# Patient Record
Sex: Female | Born: 1975 | Race: White | Hispanic: No | Smoking: Current every day smoker
Health system: Southern US, Community
[De-identification: ages and names within clinical notes are randomized; demographics above are authoritative.]

## PROBLEM LIST (undated history)

## (undated) DIAGNOSIS — M1990 Unspecified osteoarthritis, unspecified site: Secondary | ICD-10-CM

## (undated) HISTORY — PX: CHOLECYSTECTOMY: SHX55

## (undated) HISTORY — PX: APPENDECTOMY: SHX54

## (undated) HISTORY — PX: TUBAL LIGATION: SHX77

---

## 2003-08-11 ENCOUNTER — Ambulatory Visit (HOSPITAL_COMMUNITY): Admission: RE | Admit: 2003-08-11 | Discharge: 2003-08-11 | Payer: Self-pay | Admitting: Internal Medicine

## 2004-03-20 ENCOUNTER — Other Ambulatory Visit: Admission: RE | Admit: 2004-03-20 | Discharge: 2004-03-20 | Payer: Self-pay | Admitting: Family Medicine

## 2004-03-20 ENCOUNTER — Other Ambulatory Visit: Admission: RE | Admit: 2004-03-20 | Discharge: 2004-03-20 | Payer: Self-pay | Admitting: *Deleted

## 2010-06-20 ENCOUNTER — Emergency Department (HOSPITAL_COMMUNITY): Payer: Medicaid Other

## 2010-06-20 ENCOUNTER — Emergency Department (HOSPITAL_COMMUNITY)
Admission: EM | Admit: 2010-06-20 | Discharge: 2010-06-21 | Disposition: A | Discharge status: Home / Self Care | Payer: Medicaid Other | Attending: Emergency Medicine | Admitting: Emergency Medicine

## 2010-06-20 DIAGNOSIS — S0003XA Contusion of scalp, initial encounter: Secondary | ICD-10-CM | POA: Insufficient documentation

## 2010-06-20 DIAGNOSIS — S1093XA Contusion of unspecified part of neck, initial encounter: Secondary | ICD-10-CM | POA: Insufficient documentation

## 2014-04-04 ENCOUNTER — Encounter: Payer: Self-pay | Admitting: Internal Medicine

## 2021-04-18 ENCOUNTER — Other Ambulatory Visit (HOSPITAL_COMMUNITY): Payer: Self-pay

## 2021-07-01 ENCOUNTER — Emergency Department (HOSPITAL_COMMUNITY)
Admission: EM | Admit: 2021-07-01 | Discharge: 2021-07-02 | Disposition: A | Discharge status: Home / Self Care | Payer: 59 | Attending: Emergency Medicine | Admitting: Emergency Medicine

## 2021-07-01 ENCOUNTER — Encounter (HOSPITAL_COMMUNITY): Payer: Self-pay

## 2021-07-01 DIAGNOSIS — R0602 Shortness of breath: Secondary | ICD-10-CM | POA: Insufficient documentation

## 2021-07-01 DIAGNOSIS — R21 Rash and other nonspecific skin eruption: Secondary | ICD-10-CM | POA: Diagnosis not present

## 2021-07-01 NOTE — ED Triage Notes (Signed)
Pt came in with c/o rash and swelling to RLE. She had previous surgery to that leg. Rash is red, macular and petechial. 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 This has been ongoing since yest. Endorses n/v that started today ?

## 2021-07-02 ENCOUNTER — Emergency Department (HOSPITAL_COMMUNITY): Payer: 59

## 2021-07-02 DIAGNOSIS — R21 Rash and other nonspecific skin eruption: Secondary | ICD-10-CM | POA: Diagnosis present

## 2021-07-02 DIAGNOSIS — R0602 Shortness of breath: Secondary | ICD-10-CM | POA: Diagnosis not present

## 2021-07-02 DIAGNOSIS — M7989 Other specified soft tissue disorders: Secondary | ICD-10-CM | POA: Diagnosis not present

## 2021-07-02 DIAGNOSIS — R7989 Other specified abnormal findings of blood chemistry: Secondary | ICD-10-CM | POA: Diagnosis not present

## 2021-07-02 LAB — COMPREHENSIVE METABOLIC PANEL
ALT: 21 U/L (ref 0–44)
AST: 17 U/L (ref 15–41)
Albumin: 3.3 g/dL — ABNORMAL LOW (ref 3.5–5.0)
Alkaline Phosphatase: 65 U/L (ref 38–126)
Anion gap: 5 (ref 5–15)
BUN: 19 mg/dL (ref 6–20)
CO2: 26 mmol/L (ref 22–32)
Calcium: 8.3 mg/dL — ABNORMAL LOW (ref 8.9–10.3)
Chloride: 106 mmol/L (ref 98–111)
Creatinine, Ser: 0.7 mg/dL (ref 0.44–1.00)
GFR, Estimated: >60 mL/min (ref >60)
Glucose, Bld: 101 mg/dL — ABNORMAL HIGH (ref 70–99)
Potassium: 3.6 mmol/L (ref 3.5–5.1)
Sodium: 137 mmol/L (ref 135–145)
Total Bilirubin: 0.2 mg/dL — ABNORMAL LOW (ref 0.3–1.2)
Total Protein: 6.6 g/dL (ref 6.5–8.1)

## 2021-07-02 LAB — CBC WITH DIFFERENTIAL/PLATELET
Abs Immature Granulocytes: 0.06 10*3/uL (ref 0.00–0.07)
Basophils Absolute: 0.1 10*3/uL (ref 0.0–0.1)
Basophils Relative: 1 %
Eosinophils Absolute: 0.2 10*3/uL (ref 0.0–0.5)
Eosinophils Relative: 2 %
HCT: 40.5 % (ref 36.0–46.0)
Hemoglobin: 13 g/dL (ref 12.0–15.0)
Immature Granulocytes: 1 %
Lymphocytes Relative: 22 %
Lymphs Abs: 1.8 10*3/uL (ref 0.7–4.0)
MCH: 30.4 pg (ref 26.0–34.0)
MCHC: 32.1 g/dL (ref 30.0–36.0)
MCV: 94.8 fL (ref 80.0–100.0)
Monocytes Absolute: 0.7 10*3/uL (ref 0.1–1.0)
Monocytes Relative: 9 %
Neutro Abs: 5.4 10*3/uL (ref 1.7–7.7)
Neutrophils Relative %: 65 %
Platelets: 263 10*3/uL (ref 150–400)
RBC: 4.27 MIL/uL (ref 3.87–5.11)
RDW: 12.1 % (ref 11.5–15.5)
WBC: 8.2 10*3/uL (ref 4.0–10.5)
nRBC: 0 % (ref 0.0–0.2)

## 2021-07-02 LAB — TROPONIN I (HIGH SENSITIVITY): Troponin I (High Sensitivity): <2 ng/L (ref <18)

## 2021-07-02 LAB — POC URINE PREG, ED: Preg Test, Ur: NEGATIVE

## 2021-07-02 LAB — D-DIMER, QUANTITATIVE: D-Dimer, Quant: 0.78 ug/mL-FEU — ABNORMAL HIGH (ref 0.00–0.50)

## 2021-07-02 LAB — HCG, QUANTITATIVE, PREGNANCY: hCG, Beta Chain, Quant, S: <1 m[IU]/mL (ref <5)

## 2021-07-02 LAB — BRAIN NATRIURETIC PEPTIDE: B Natriuretic Peptide: 18 pg/mL (ref 0.0–100.0)

## 2021-07-02 MED ORDER — IOHEXOL 350 MG/ML SOLN
75.0000 mL | Freq: Once | INTRAVENOUS | Status: AC | PRN
  Administered 2021-07-02: 75 mL via INTRAVENOUS

## 2021-07-02 MED ORDER — RIVAROXABAN (XARELTO) VTE STARTER PACK (15 & 20 MG): ORAL_TABLET | ORAL | 0 refills | Status: DC

## 2021-07-02 MED ORDER — CEPHALEXIN 500 MG PO CAPS: 500.0000 mg | ORAL_CAPSULE | Freq: Four times a day (QID) | ORAL | 0 refills | Status: DC

## 2021-07-02 MED ORDER — CEPHALEXIN 500 MG PO CAPS
1000.0000 mg | ORAL_CAPSULE | Freq: Once | ORAL | Status: AC
  Administered 2021-07-02: 1000 mg via ORAL
  Filled 2021-07-02: qty 2

## 2021-07-02 NOTE — ED Provider Notes (Signed)
?[facility] ?Provider Note ? ? ?CSN: [id number] ?Arrival date & time: 07/01/21  2306 ? ?  ? ?History ? ?Chief Complaint  ?Patient presents with  ? Rash  ? Shortness of Breath  ? ? ?Christie Nicholson is a 46 y.o. female. ? ?46 year old female presents the ER today secondary to multiple symptoms.  She is dates that over the last couple days she has had a rash to her right leg with some swelling.  She also has noticed some shortness of breath during that time as well.  No fevers.  States it is tender to touch. ? ? ?Rash ?Associated symptoms: shortness of breath   ?Shortness of Breath ?Associated symptoms: rash   ? ?  ? ?Home Medications ?Prior to Admission medications   ?Medication Sig Start Date End Date Taking? Authorizing Provider  ?cephALEXin (KEFLEX) 500 MG capsule Take 1 capsule (500 mg total) by mouth 4 (four) times daily. 07/02/21  Yes Hason Ofarrell, [name], MD  ?RIVAROXABAN [name] Reasons) VTE STARTER PACK (15 & 20 MG) Follow package directions: Take one 15mg  tablet by mouth twice a day. On day 22, switch to one 20mg  tablet once a day. Take with food. 07/02/21  Yes Raevin Wierenga, [name], MD  ?   ? ?Allergies    ?Ciprofloxacin and Phenergan [promethazine hcl]   ? ?Review of Systems   ?Review of Systems  ?Respiratory:  Positive for shortness of breath.   ?Skin:  Positive for rash.  ? ?Physical Exam ?Updated Vital Signs ?BP 117/77   Pulse 96   Temp 97.8 ?F (36.6 ?C) (Oral)   Resp 20   Ht 5\' 4"  (1.626 m)   Wt 61.2 kg   SpO2 100%   BMI 23.17 kg/m?  ?Physical Exam ?Vitals and nursing note reviewed.  ?Constitutional:   ?   Appearance: She is well-developed.  ?HENT:  ?   Head: Normocephalic and atraumatic.  ?Cardiovascular:  ?   Rate and Rhythm: Normal rate and regular rhythm.  ?Pulmonary:  ?   Effort: No respiratory distress.  ?   Breath sounds: No stridor. No decreased breath sounds, wheezing or rhonchi.  ?Abdominal:  ?   General: There is no distension.  ?Musculoskeletal:     ?   General: Normal range of motion.  ?    Cervical back: Normal range of motion.  ?   Right lower leg: Tenderness present. Edema (T. Keele rash to her right leg, warm and tender to touch, mildly indurated) present.  ?   Left lower leg: No tenderness. No edema.  ?Neurological:  ?   General: No focal deficit present.  ?   Mental Status: She is alert.  ? ? ?ED Results / Procedures / Treatments   ?Labs ?(all labs ordered are listed, but only abnormal results are displayed) ?Labs Reviewed  ?COMPREHENSIVE METABOLIC PANEL - Abnormal; Notable for the following components:  ?    Result Value  ? Glucose, Bld 101 (*)   ? Calcium 8.3 (*)   ? Albumin 3.3 (*)   ? Total Bilirubin 0.2 (*)   ? All other components within normal limits  ?D-DIMER, QUANTITATIVE - Abnormal; Notable for the following components:  ? D-Dimer, Quant 0.78 (*)   ? All other components within normal limits  ?CBC WITH DIFFERENTIAL/PLATELET  ?BRAIN NATRIURETIC PEPTIDE  ?HCG, QUANTITATIVE, PREGNANCY  ?POC URINE PREG, ED  ?TROPONIN I (HIGH SENSITIVITY)  ? ? ?EKG ?None ? ?Radiology ?DG Chest 2 View ? ?Result Date: 07/02/2021 ?CLINICAL DATA:  Shortness of  breath and right lower extremity swelling. EXAM: CHEST - 2 VIEW COMPARISON:  None. FINDINGS: The heart size and mediastinal contours are within normal limits. The lungs are hyperinflated. Both lungs are clear. Radiopaque surgical clips are seen within the right upper quadrant. Bilateral chronic rib fractures are noted. IMPRESSION: No active cardiopulmonary disease. Electronically Signed   By: Virgina Norfolk M.D.   On: 07/02/2021 00:49  ? ?CT Angio Chest PE W and/or Wo Contrast ? ?Result Date: 07/02/2021 ?CLINICAL DATA:  Shortness of breath with positive D-dimer. EXAM: CT ANGIOGRAPHY CHEST WITH CONTRAST TECHNIQUE: Multidetector CT imaging of the chest was performed using the standard protocol during bolus administration of intravenous contrast. Multiplanar CT image reconstructions and MIPs were obtained to evaluate the vascular anatomy. RADIATION DOSE  REDUCTION: This exam was performed according to the departmental dose-optimization program which includes automated exposure control, adjustment of the mA and/or kV according to patient size and/or use of iterative reconstruction technique. CONTRAST:  36mL OMNIPAQUE IOHEXOL 350 MG/ML SOLN COMPARISON:  None. FINDINGS: Cardiovascular: Satisfactory opacification of the pulmonary arteries to the segmental level. No evidence of pulmonary embolism. Normal heart size. No pericardial effusion. Mediastinum/Nodes: No enlarged mediastinal, hilar, or axillary lymph nodes. Thyroid gland, trachea, and esophagus demonstrate no significant findings. Lungs/Pleura: Lungs are clear. No pleural effusion or pneumothorax. Upper Abdomen: Cholecystectomy clips are present. Musculoskeletal: No chest wall abnormality. No acute or significant osseous findings. Review of the MIP images confirms the above findings. IMPRESSION: 1. No evidence for pulmonary embolism. 2. No acute cardiopulmonary process. Electronically Signed   By: Ronney Asters M.D.   On: 07/02/2021 02:26   ? ?Procedures ?Procedures  ? ? ?Medications Ordered in ED ?Medications  ?iohexol (OMNIPAQUE) 350 MG/ML injection 75 mL (75 mLs Intravenous Contrast Given 07/02/21 0202)  ?cephALEXin (KEFLEX) capsule 1,000 mg (1,000 mg Oral Given 07/02/21 0257)  ? ? ?ED Course/ Medical Decision Making/ A&P ?  ?                        ?Medical Decision Making ?Amount and/or Complexity of Data Reviewed ?Labs: ordered. ?Radiology: ordered. ?ECG/medicine tests: ordered. ? ?Risk ?Prescription drug management. ? ?Here with rash/edema to RLE with associated dyspnea. Will evaluate for DVT/PE vs cellulitis.  ?D dimer elevated. Will ct scan to ensure no PE. If negative, will need to return tomorrow for DVT study. If positive, will start anticoagulation and dispo appropriately.  ?Ct negative. Rx for xarelto and keflex given, will start one or other depending on [place] results, verbally explained and wrote on  prescriptions.  ? ? ?Final Clinical Impression(s) / ED Diagnoses ?Final diagnoses:  ?Rash  ? ? ?Rx / DC Orders ?ED Discharge Orders   ? ?      Ordered  ?  US Venous Img Lower Unilateral Right       ? 07/02/21 0246  ?  cephALEXin (KEFLEX) 500 MG capsule  4 times daily       ? 07/02/21 0246  ?  RIVAROXABAN (XARELTO) VTE STARTER PACK (15 & 20 MG)       ? 07/02/21 0246  ? ?  ?  ? ?  ? ? ?  ?Merrily Pew, MD ?07/02/21 [phone] ? ?

## 2021-07-03 ENCOUNTER — Telehealth: Payer: Self-pay

## 2021-07-03 NOTE — Telephone Encounter (Signed)
Called pt and made appt

## 2021-07-11 ENCOUNTER — Encounter: Payer: Self-pay | Admitting: Physician Assistant

## 2021-07-11 DIAGNOSIS — I878 Other specified disorders of veins: Secondary | ICD-10-CM | POA: Diagnosis not present

## 2021-07-11 DIAGNOSIS — M79604 Pain in right leg: Secondary | ICD-10-CM | POA: Diagnosis not present

## 2021-07-11 DIAGNOSIS — M7989 Other specified soft tissue disorders: Secondary | ICD-10-CM | POA: Diagnosis not present

## 2021-07-11 DIAGNOSIS — I83018 Varicose veins of right lower extremity with ulcer other part of lower leg: Secondary | ICD-10-CM | POA: Diagnosis not present

## 2021-07-17 DIAGNOSIS — H16142 Punctate keratitis, left eye: Secondary | ICD-10-CM | POA: Diagnosis not present

## 2021-07-17 DIAGNOSIS — T1512XA Foreign body in conjunctival sac, left eye, initial encounter: Secondary | ICD-10-CM | POA: Diagnosis not present

## 2021-07-25 ENCOUNTER — Ambulatory Visit: Payer: 59 | Admitting: Physician Assistant

## 2021-07-25 ENCOUNTER — Encounter: Payer: Self-pay | Admitting: Physician Assistant

## 2021-07-25 NOTE — Progress Notes (Unsigned)
Patient ID: Christie Nicholson, female   DOB: 01-Oct-1975, 46 y.o.   MRN: [id number]  After ED visit 07/01/2021 Here with rash/edema to RLE with associated dyspnea. Will evaluate for DVT/PE vs cellulitis.  D dimer elevated. Will ct scan to ensure no PE. If negative, will need to return tomorrow for DVT study. If positive, will start anticoagulation and dispo appropriately.  Ct negative. Rx for xarelto and keflex given, will start one or other depending on [place] results, verbally explained and wrote on prescriptions.

## 2021-10-19 DIAGNOSIS — Z791 Long term (current) use of non-steroidal anti-inflammatories (NSAID): Secondary | ICD-10-CM | POA: Diagnosis not present

## 2021-10-19 DIAGNOSIS — G8929 Other chronic pain: Secondary | ICD-10-CM | POA: Diagnosis not present

## 2021-10-19 DIAGNOSIS — R69 Illness, unspecified: Secondary | ICD-10-CM | POA: Diagnosis not present

## 2021-10-19 DIAGNOSIS — Z881 Allergy status to other antibiotic agents status: Secondary | ICD-10-CM | POA: Diagnosis not present

## 2021-10-19 DIAGNOSIS — Z8249 Family history of ischemic heart disease and other diseases of the circulatory system: Secondary | ICD-10-CM | POA: Diagnosis not present

## 2021-10-19 DIAGNOSIS — R32 Unspecified urinary incontinence: Secondary | ICD-10-CM | POA: Diagnosis not present

## 2021-10-19 DIAGNOSIS — Z9181 History of falling: Secondary | ICD-10-CM | POA: Diagnosis not present

## 2022-03-31 ENCOUNTER — Encounter (HOSPITAL_COMMUNITY): Payer: Self-pay | Admitting: Emergency Medicine

## 2022-03-31 ENCOUNTER — Emergency Department (HOSPITAL_COMMUNITY)
Admission: EM | Admit: 2022-03-31 | Discharge: 2022-03-31 | Disposition: A | Discharge status: Home / Self Care | Payer: Medicaid Other | Attending: Emergency Medicine | Admitting: Emergency Medicine

## 2022-03-31 ENCOUNTER — Other Ambulatory Visit: Payer: Self-pay

## 2022-03-31 DIAGNOSIS — X58XXXA Exposure to other specified factors, initial encounter: Secondary | ICD-10-CM | POA: Insufficient documentation

## 2022-03-31 DIAGNOSIS — S0501XA Injury of conjunctiva and corneal abrasion without foreign body, right eye, initial encounter: Secondary | ICD-10-CM | POA: Diagnosis not present

## 2022-03-31 DIAGNOSIS — H5789 Other specified disorders of eye and adnexa: Secondary | ICD-10-CM | POA: Diagnosis present

## 2022-03-31 MED ORDER — TOBRAMYCIN 0.3 % OP SOLN: 1.0000 [drp] | OPHTHALMIC | 0 refills | Status: DC

## 2022-03-31 MED ORDER — FLUORESCEIN SODIUM 1 MG OP STRP
1.0000 | ORAL_STRIP | Freq: Once | OPHTHALMIC | Status: DC
  Filled 2022-03-31: qty 1

## 2022-03-31 MED ORDER — FLUORESCEIN SODIUM 1 MG OP STRP
1.0000 | ORAL_STRIP | Freq: Once | OPHTHALMIC | Status: AC
  Administered 2022-03-31: 1 via OPHTHALMIC

## 2022-03-31 MED ORDER — TETRACAINE HCL 0.5 % OP SOLN
1.0000 [drp] | Freq: Once | OPHTHALMIC | Status: AC
  Administered 2022-03-31: 1 [drp] via OPHTHALMIC

## 2022-03-31 MED ORDER — TETRACAINE HCL 0.5 % OP SOLN
2.0000 [drp] | Freq: Once | OPHTHALMIC | Status: DC
  Filled 2022-03-31: qty 4

## 2022-03-31 NOTE — ED Triage Notes (Signed)
Scratched right eye yesterday and seen at [facility] and was told to come to ED. States put the contact back in bc it made the pain better. Woke this am with swelling and severe pain. Mild redness/swelling noted to right lids and redness noted to sclera. Eyes watering. Nad.

## 2022-03-31 NOTE — Discharge Instructions (Signed)
Please follow-up with your eye doctor within the next 48 hours for a formal eye exam Absolutely do not wear any contact lenses or rub your eyes until you see the eye doctor Tobramycin is the eyedrop which I have prescribed, make sure you are using 1 or 2 drops in the right eye every 4 hours Tylenol or ibuprofen for pain

## 2022-03-31 NOTE — ED Provider Notes (Signed)
[name] Provider Note   CSN: [id number] Arrival date & time: 03/31/22  1020     History  Chief Complaint  Patient presents with   Eye Pain    Christie Nicholson is a 47 y.o. female.   Eye Pain   Scratched eye yesterday Used contact last night b/c states it made it felt better Saw [facility] yesterday and told to come here b/c they couldn't take care of her and to come to ED - she didn't until today She put a contact lens back in last night because it made it feel little bit better but this morning she had increasing redness pain and swelling of the eyelid.    Home Medications Prior to Admission medications   Medication Sig Start Date End Date Taking? Authorizing Provider  tobramycin (TOBREX) 0.3 % ophthalmic solution Place 1 drop into the right eye every 4 (four) hours. Place one drop in the affected eye every 4 hours for 7 days 03/31/22  Yes [name], MD  cephALEXin (KEFLEX) 500 MG capsule Take 1 capsule (500 mg total) by mouth 4 (four) times daily. 07/02/21   Mesner, [name], MD  RIVAROXABAN [name] Reasons) VTE STARTER PACK (15 & 20 MG) Follow package directions: Take one 15mg  tablet by mouth twice a day. On day 22, switch to one 20mg  tablet once a day. Take with food. 07/02/21   Mesner, [name], MD      Allergies    Ciprofloxacin and Phenergan [promethazine hcl]    Review of Systems   Review of Systems  Eyes:  Positive for pain.    Physical Exam Updated Vital Signs BP (!) 145/100 (BP Location: Right Arm)   Pulse 100   Temp 98.2 F (36.8 C) (Oral)   Resp 18   LMP 08/01/2021 (Approximate) Comment: "Im going through early menopause"  SpO2 100%  Physical Exam Vitals and nursing note reviewed.  Constitutional:      Appearance: She is well-developed. She is not diaphoretic.  HENT:     Head: Normocephalic and atraumatic.  Eyes:     Comments: Watering of the right eye, minimal swelling of the right eyelid, no foreign body seen, left  eye is totally normal.  Under fluorescein and tetracaine with a Woods lamp the right eye is seen to have a large central corneal abrasion overlying the middle of the cornea.  There is no leakage of intraocular fluid  Pulmonary:     Effort: Pulmonary effort is normal. No respiratory distress.  Skin:    General: Skin is warm and dry.     Findings: No erythema or rash.  Neurological:     Mental Status: She is alert.     Coordination: Coordination normal.     ED Results / Procedures / Treatments   Labs (all labs ordered are listed, but only abnormal results are displayed) Labs Reviewed - No data to display  EKG None  Radiology No results found.  Procedures Procedures    Medications Ordered in ED Medications  fluorescein ophthalmic strip 1 strip (has no administration in time range)  tetracaine (PONTOCAINE) 0.5 % ophthalmic solution 1 drop (has no administration in time range)    ED Course/ Medical Decision Making/ A&P                             Medical Decision Making Risk Prescription drug management.   The patient was counseled at length regarding  her corneal injury and the treatment including antibiotics, avoiding contact lenses or rubbing and following up with a local ophthalmologist, she has agreed to do all of these things.  She appears stable for discharge, she will be given a prescription for tobramycin.  She wanted to take the tetracaine home but I told her this was not advised given the potential softening of the cornea that it would cause.  She is agreeable        Final Clinical Impression(s) / ED Diagnoses Final diagnoses:  Abrasion of right cornea, initial encounter    Rx / DC Orders ED Discharge Orders          Ordered    tobramycin (TOBREX) 0.3 % ophthalmic solution  Every 4 hours        03/31/22 1046              [name], MD 03/31/22 1048

## 2022-08-20 DIAGNOSIS — R1010 Upper abdominal pain, unspecified: Secondary | ICD-10-CM | POA: Diagnosis not present

## 2022-08-20 DIAGNOSIS — N924 Excessive bleeding in the premenopausal period: Secondary | ICD-10-CM | POA: Diagnosis not present

## 2022-08-20 DIAGNOSIS — Z6824 Body mass index (BMI) 24.0-24.9, adult: Secondary | ICD-10-CM | POA: Diagnosis not present

## 2022-08-20 DIAGNOSIS — I10 Essential (primary) hypertension: Secondary | ICD-10-CM | POA: Diagnosis not present

## 2022-08-20 DIAGNOSIS — M24671 Ankylosis, right ankle: Secondary | ICD-10-CM | POA: Diagnosis not present

## 2022-08-24 IMAGING — DX DG CHEST 2V
2 series · 2 of 2 positions shown · non-contrast
Comparison: None.

CLINICAL DATA: Shortness of breath and right lower extremity
swelling.

EXAM:
CHEST - 2 VIEW

[chest pa]
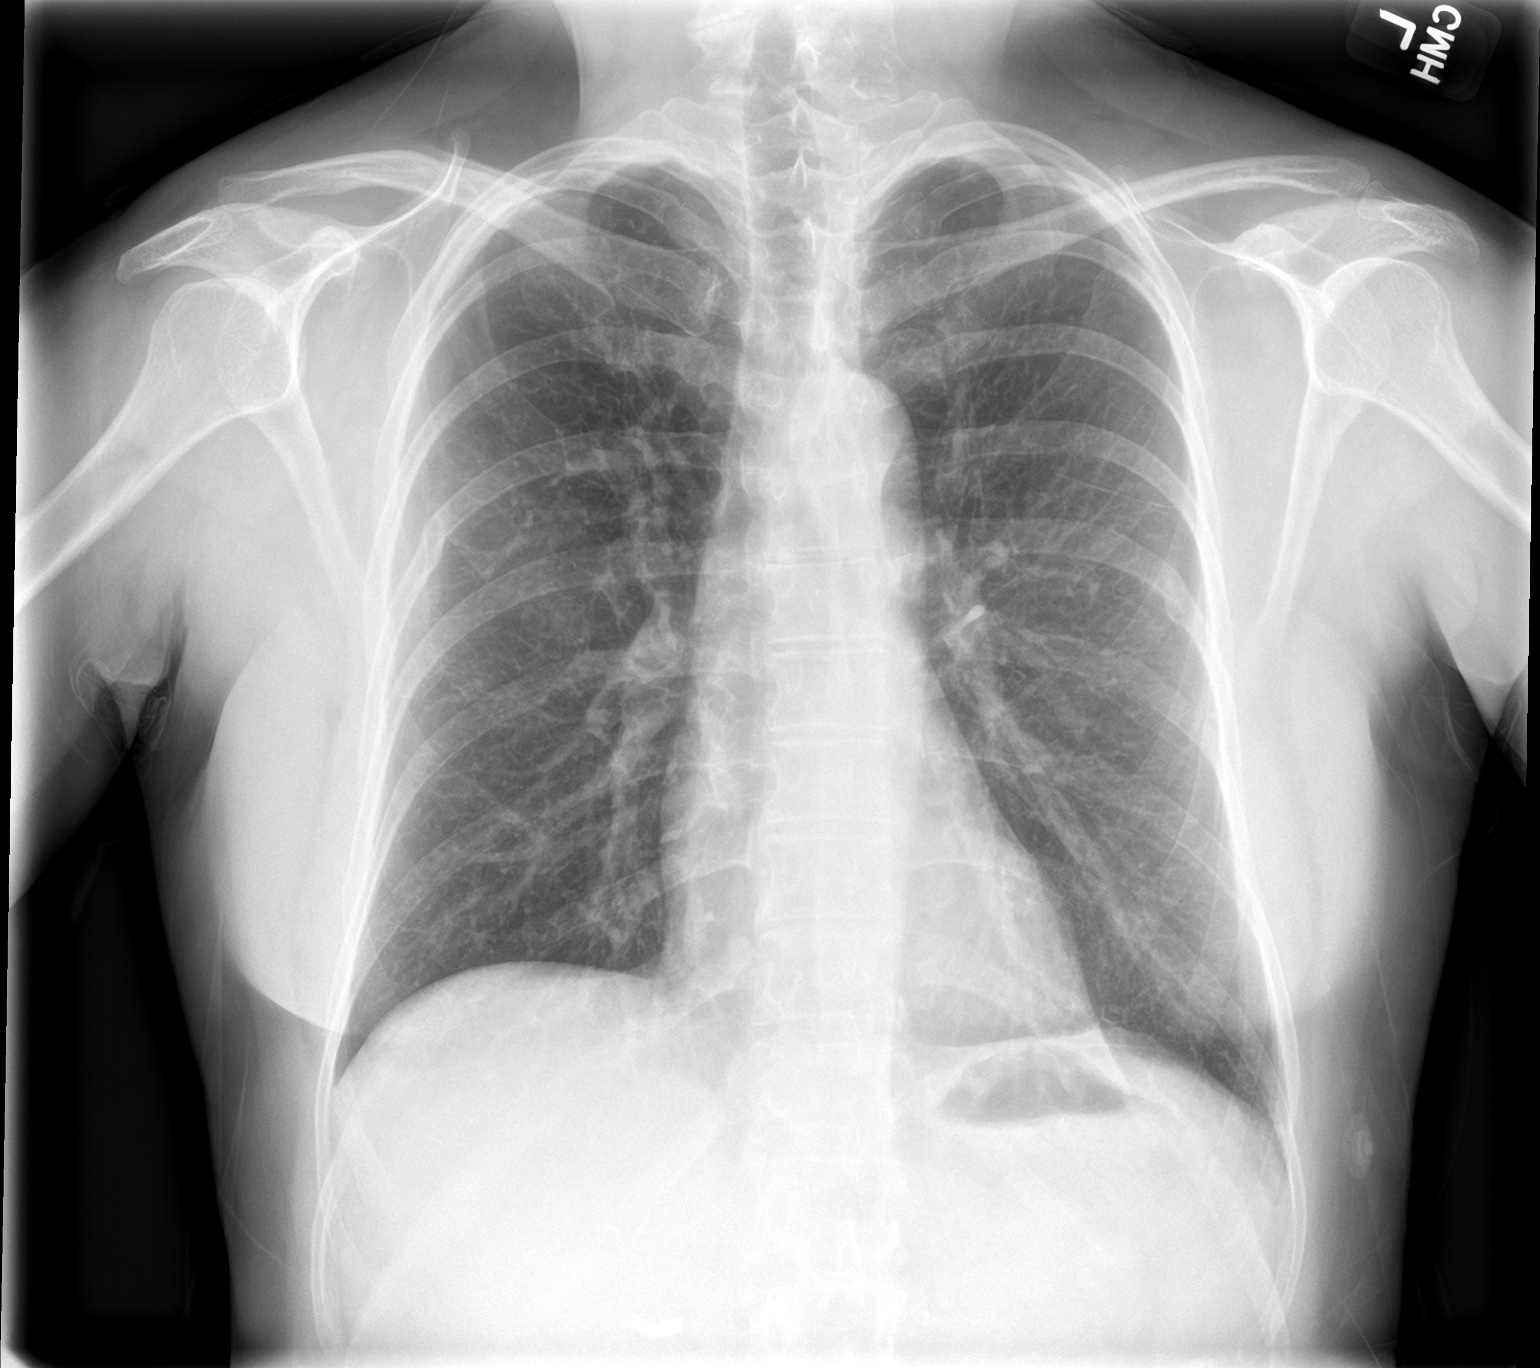

[chest lat]
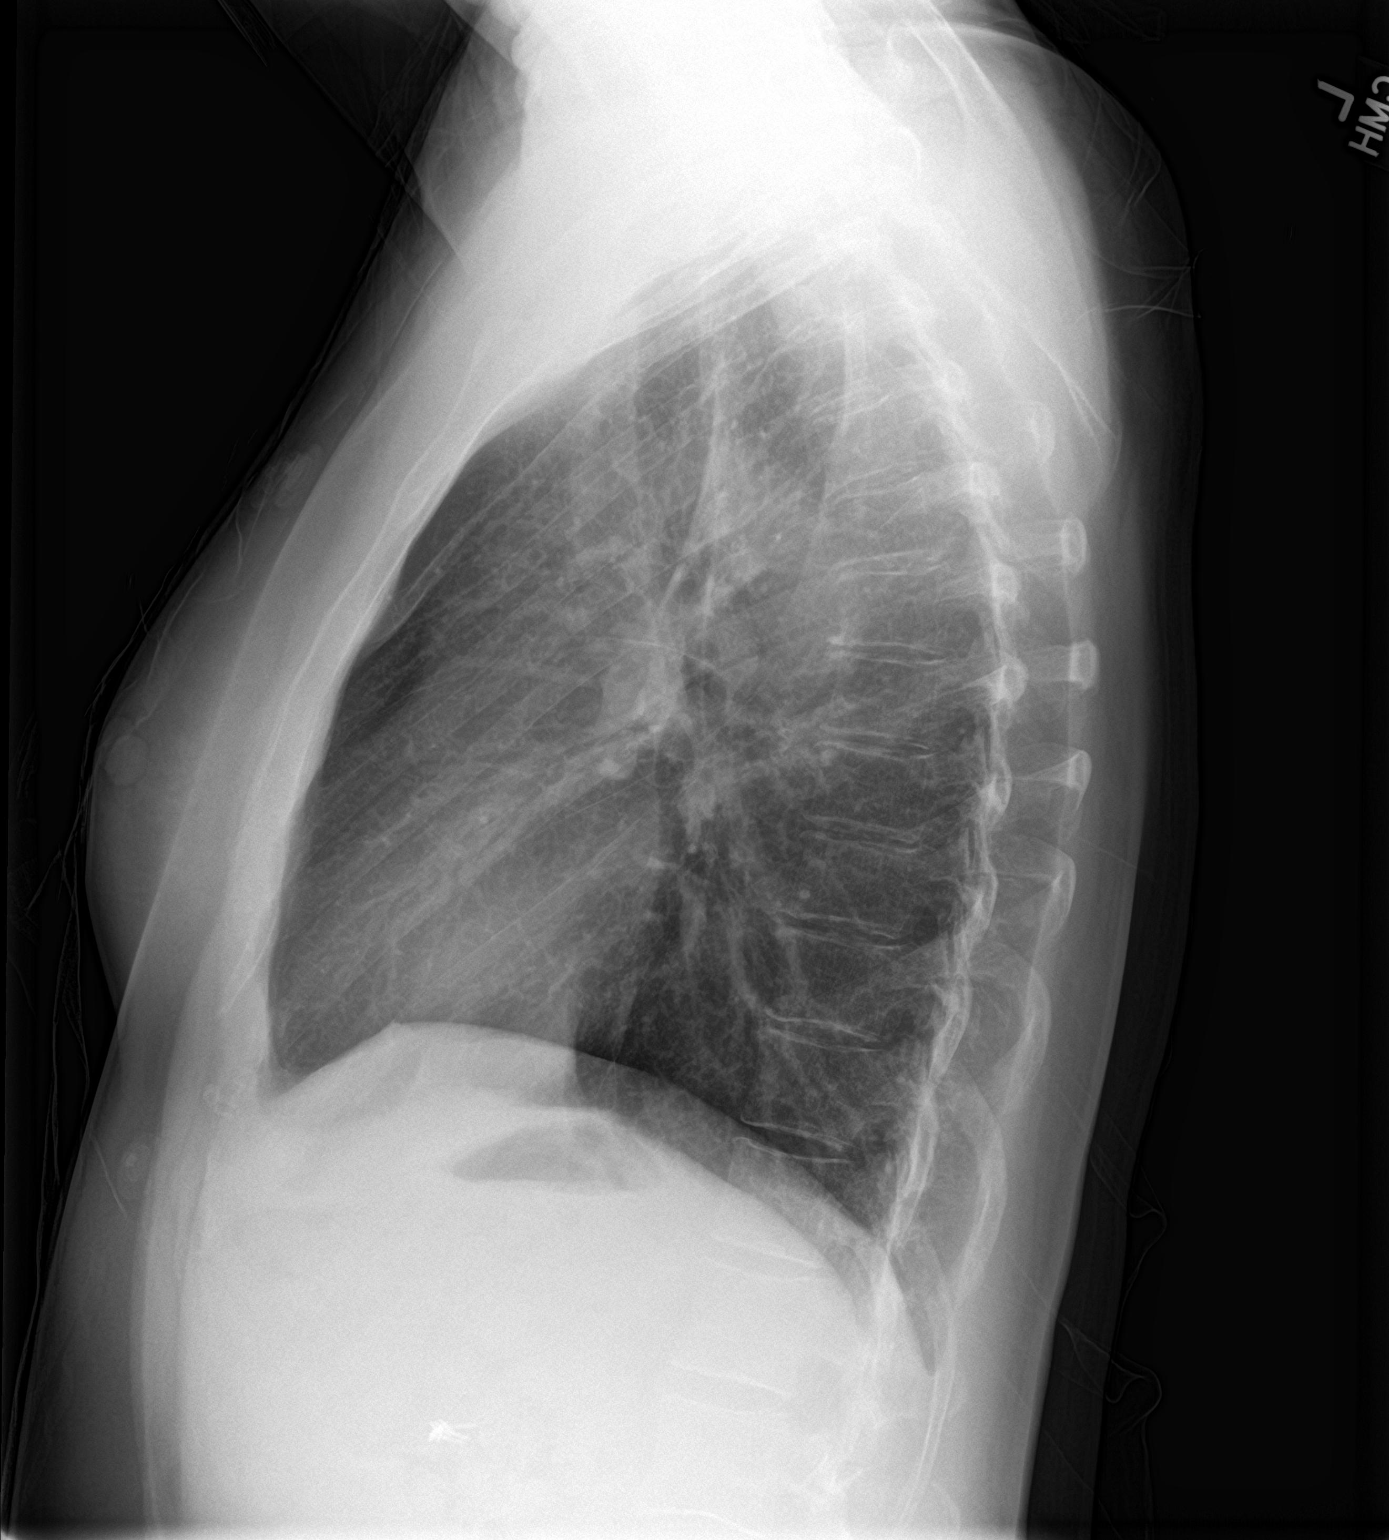

[2 of 2 positions shown; findings below may reference images not displayed]

FINDINGS: The heart size and mediastinal contours are within normal limits.
The lungs are hyperinflated. Both lungs are clear. Radiopaque
surgical clips are seen within the right upper quadrant. Bilateral
chronic rib fractures are noted.
IMPRESSION: No active cardiopulmonary disease.

## 2022-08-29 ENCOUNTER — Encounter: Payer: Self-pay | Admitting: Orthopaedic Surgery

## 2022-08-29 ENCOUNTER — Ambulatory Visit (INDEPENDENT_AMBULATORY_CARE_PROVIDER_SITE_OTHER): Payer: 59 | Admitting: Orthopaedic Surgery

## 2022-08-29 VITALS — Ht 64.0 in | Wt 143.0 lb

## 2022-08-29 DIAGNOSIS — S92102K Unspecified fracture of left talus, subsequent encounter for fracture with nonunion: Secondary | ICD-10-CM | POA: Diagnosis not present

## 2022-08-29 DIAGNOSIS — M25571 Pain in right ankle and joints of right foot: Secondary | ICD-10-CM

## 2022-08-29 DIAGNOSIS — R109 Unspecified abdominal pain: Secondary | ICD-10-CM | POA: Diagnosis not present

## 2022-08-29 DIAGNOSIS — R102 Pelvic and perineal pain: Secondary | ICD-10-CM | POA: Diagnosis not present

## 2022-09-03 DIAGNOSIS — S92111K Displaced fracture of neck of right talus, subsequent encounter for fracture with nonunion: Secondary | ICD-10-CM | POA: Insufficient documentation

## 2022-09-03 DIAGNOSIS — S92102K Unspecified fracture of left talus, subsequent encounter for fracture with nonunion: Secondary | ICD-10-CM | POA: Insufficient documentation
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
## 2022-11-07 ENCOUNTER — Ambulatory Visit (INDEPENDENT_AMBULATORY_CARE_PROVIDER_SITE_OTHER): Payer: Medicaid Other | Admitting: Orthopaedic Surgery

## 2022-11-07 DIAGNOSIS — S92111K Displaced fracture of neck of right talus, subsequent encounter for fracture with nonunion: Secondary | ICD-10-CM
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
## 2022-12-05 ENCOUNTER — Ambulatory Visit: Payer: Medicaid Other | Admitting: Orthopaedic Surgery

## 2022-12-05 DIAGNOSIS — S92111K Displaced fracture of neck of right talus, subsequent encounter for fracture with nonunion: Secondary | ICD-10-CM

## 2022-12-05 NOTE — Progress Notes (Signed)
Office Visit Note   Patient: Christie Nicholson           Date of Birth: November 25, 1975           MRN: [id number] Visit Date: 12/05/2022              Requested by: Medicine, [facility] Internal [address] [place],  [place] [address] PCP: Medicine, [facility] Internal   Assessment & Plan: Visit Diagnoses:  1. 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  She will need additional surgery she will call to see Dr. Lajoyce Corners.  Follow-Up Instructions: No follow-ups on file.   Orders:  No orders of the defined types were placed in this encounter.  No orders of the defined types were placed in this encounter.     Procedures: No procedures performed   Clinical Data: No additional findings.   Subjective: Chief Complaint  Patient presents with   Right Ankle - Pain, Follow-up    Here for paperwork.     HPI 47 year old female returns I discussed with her getting into see Dr. Lajoyce Corners she states she does not recall anything about that discussion.  Patient CT scan we reviewed again which shows talar neck nonunion.  She has hardware present which is loose had subtalar arthritis and will need surgery to stabilize her ankle.  Is not able to do her job standing up until this is taken care of.  I discussed with her she likely going to be out for 3 to 4 months after the surgery as it heals and then should be able to resume work activity.  Her attorney is sent a form or paralegal sent a form that looks like an Administrator, arts.  If they like an FCE they can prepay for the FCE which is usually about $500.  Patient likely will need fusion surgery.  I gave her the phone number to schedule appointment with Dr. Lajoyce Corners.  Review of Systems unchanged   Objective: Vital Signs: There were no vitals taken for this visit.  Physical Exam Constitutional:       Appearance: She is well-developed.  HENT:     Head: Normocephalic.     Right Ear: External ear normal.     Left Ear: External ear normal. There is no impacted cerumen.  Eyes:     Pupils: Pupils are equal, round, and reactive to light.  Neck:     Thyroid: No thyromegaly.     Trachea: No tracheal deviation.  Cardiovascular:     Rate and Rhythm: Normal rate.  Pulmonary:     Effort: Pulmonary effort is normal.  Abdominal:     Palpations: Abdomen is soft.  Musculoskeletal:     Cervical back: No rigidity.  Skin:    General: Skin is warm and dry.  Neurological:     Mental Status: She is alert and oriented to person, place, and time.  Psychiatric:        Behavior: Behavior normal.     Ortho Exam patient has pain with subtalar motion ankle range of motion she is ambulating with a cane.  Healed scars on her ankle from previous surgery.  Specialty Comments:  No specialty comments available.  Imaging:  [facility] Outside Information   CT Lower Extremity Right Wo Contrast  Anatomical Region Laterality Modality  Hip right Computed Tomography  Thigh -- --  Knee -- --  Leg -- --  Ankle -- --  Foot -- --   Impression  1. Healed talar neck fracture status post ORIF. No hardware complication. 2. Severe post-traumatic posterior subtalar osteoarthritis. 3. Mildly increased sclerosis along the anterior aspect of the talar dome, which could reflect chronic avascular necrosis. No subchondral collapse.   Electronically Signed   By: Obie Dredge M.D.   On: 10/31/2022 08:40 Narrative  CLINICAL DATA:  Chronic right foot pain. History of prior fracture and ORIF in March 2022.  EXAM: CT OF THE LOWER RIGHT EXTREMITY WITHOUT CONTRAST  TECHNIQUE: Multidetector CT imaging of the right lower extremity was performed according to the standard protocol.  RADIATION DOSE REDUCTION: This exam was performed according to the departmental dose-optimization program which includes  automated exposure control, adjustment of the mA and/or kV according to patient size and/or use of iterative reconstruction technique.  COMPARISON:  Right ankle x-rays dated June 19, 2022.  FINDINGS: Bones/Joint/Cartilage  Healed talar neck fracture status post ORIF. No evidence of hardware failure or loosening. No acute fracture or dislocation. There are three well corticated fracture fragments along the medial, posterior, and lateral aspects of the subtalar joint. The largest fragment laterally likely represents the displaced lateral talar process. There is some mildly increased sclerosis along the anterior aspect of the talar dome.  There is severe posterior subtalar joint space narrowing with subchondral sclerosis, cystic change, and marginal osteophytosis. Remaining joint spaces are relatively preserved. The ankle mortise is symmetric. The talar dome is intact. Osteopenia. Small tibiotalar joint effusion.  Ligaments  Ligaments are suboptimally evaluated by CT.  Muscles and Tendons Grossly intact.  Soft tissue No fluid collection or hematoma.  No soft tissue mass. Procedure Note  Rica Records, MD - 10/31/2022 Formatting of this note might be different from the original. CLINICAL DATA:  Chronic right foot pain. History of prior fracture and ORIF in March 2022.  EXAM: CT OF THE LOWER RIGHT EXTREMITY WITHOUT CONTRAST  TECHNIQUE: Multidetector CT imaging of the right lower extremity was performed according to the standard protocol.  RADIATION DOSE REDUCTION: This exam was performed according to the departmental dose-optimization program which includes automated exposure control, adjustment of the mA and/or kV according to patient size and/or use of iterative reconstruction technique.  COMPARISON:  Right ankle x-rays dated June 19, 2022.  FINDINGS: Bones/Joint/Cartilage  Healed talar neck fracture status post ORIF. No evidence of hardware failure or  loosening. No acute fracture or dislocation. There are three well corticated fracture fragments along the medial, posterior, and lateral aspects of the subtalar joint. The largest fragment laterally likely represents the displaced lateral talar process. There is some mildly increased sclerosis along the anterior aspect of the talar dome.  There is severe posterior subtalar joint space narrowing with subchondral sclerosis, cystic change, and marginal osteophytosis. Remaining joint spaces are relatively preserved. The ankle mortise is symmetric. The talar dome is intact. Osteopenia. Small tibiotalar joint effusion.  Ligaments  Ligaments are suboptimally evaluated by CT.  Muscles and Tendons Grossly intact.  Soft tissue No fluid collection or hematoma.  No soft tissue mass.  IMPRESSION: 1. Healed talar neck fracture status post ORIF. No hardware complication. 2. Severe post-traumatic posterior subtalar osteoarthritis. 3. Mildly increased sclerosis along the anterior aspect of the talar dome, which could reflect chronic avascular necrosis. No subchondral collapse.   Electronically Signed   By: Obie Dredge M.D.   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  No family history on file.  Past Surgical History:  Procedure Laterality Date   APPENDECTOMY     CHOLECYSTECTOMY     TUBAL LIGATION     Social History   Occupational History   Not on file  Tobacco Use   Smoking status: Every Day    Current packs/day: 1.00    Types: Cigarettes   Smokeless tobacco: Never  Substance and Sexual Activity   Alcohol use: Not Currently   Drug use: Yes    Types:  Methamphetamines    Comment: last used nov 2021   Sexual activity: Not on file

## 2022-12-16 ENCOUNTER — Ambulatory Visit (INDEPENDENT_AMBULATORY_CARE_PROVIDER_SITE_OTHER): Payer: Medicaid Other | Admitting: Orthopedic Surgery

## 2022-12-16 DIAGNOSIS — S92102K Unspecified fracture of left talus, subsequent encounter for fracture with nonunion: Secondary | ICD-10-CM

## 2022-12-16 DIAGNOSIS — M25571 Pain in right ankle and joints of right foot: Secondary | ICD-10-CM | POA: Diagnosis not present

## 2022-12-16 DIAGNOSIS — S92111K Displaced fracture of neck of right talus, subsequent encounter for fracture with nonunion: Secondary | ICD-10-CM | POA: Diagnosis not present

## 2022-12-17 ENCOUNTER — Encounter: Payer: Self-pay | Admitting: Orthopedic Surgery

## 2022-12-17 NOTE — Progress Notes (Signed)
Office Visit Note   Patient: Christie Nicholson           Date of Birth: 05-Dec-1975           MRN: [id number] Visit Date: 12/16/2022              Requested by: Medicine, [facility] Internal [address] [place],  [place] [address] PCP: Medicine, [facility] Internal  Chief Complaint  Patient presents with   Right Ankle - Fracture      HPI: Patient is a 47 year old woman who is seen for initial evaluation for subtalar arthritis status post open reduction internal fixation for talar neck fracture approximately 2 years ago.  Patient has pain with activities of daily living she has to use a cane for ambulation.  Patient states she cannot perform her normal activities secondary to pain.  She has had a subtalar injection without relief.  Assessment & Plan: Visit Diagnoses:  1. 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 Pain in right ankle and joints of right foot   3. 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  Patient states she would like to proceed with surgery.  Will plan for a subtalar fusion.  Risks and benefits were discussed including need for additional surgery, persistent pain, nonunion.  Patient states she understands wished to proceed at this time.  Follow-Up Instructions: No follow-ups on file.   Ortho Exam  Patient is alert, oriented, no adenopathy, well-dressed, normal affect, normal respiratory effort. Examination patient has a strong palpable dorsalis pedis pulse.  She has minimal range of motion of the right ankle.  There is essentially no subtalar motion and has pain with attempted subtalar motion.  She has pain to palpation over the sinus Tarsi.  Ankle range of motion dorsiflexion 0 degrees plantarflexion 20 degrees.  Review of the CT scan shows advanced subtalar traumatic arthritis.  The talar neck fracture is  healed status post open duction internal fixation.  Imaging: No results found. No images are attached to the encounter.  Labs: No results found for: "HGBA1C", "ESRSEDRATE", "CRP", "LABURIC", "REPTSTATUS", "GRAMSTAIN", "CULT", "LABORGA"   Lab Results  Component Value Date   ALBUMIN 3.3 (L) 07/02/2021    No results found for: "MG" No results found for: "VD25OH"  No results found for: "PREALBUMIN"    Latest Ref Rng & Units 07/02/2021   12:10 AM  CBC EXTENDED  WBC 4.0 - 10.5 K/uL 8.2   RBC 3.87 - 5.11 MIL/uL 4.27   Hemoglobin 12.0 - 15.0 g/dL 62.1   HCT 30.8 - 65.7 % 40.5   Platelets 150 - 400 K/uL 263   NEUT# 1.7 - 7.7 K/uL 5.4   Lymph# 0.7 - 4.0 K/uL 1.8      There is no height or weight on file to calculate BMI.  Orders:  No orders of the defined types were placed in this encounter.  No orders of the defined types were placed in this encounter.    Procedures: No procedures performed  Clinical Data: No additional findings.  ROS:  All other systems negative, except as noted in the HPI. Review of Systems  Objective: Vital Signs: There were no vitals taken for this visit.  Specialty Comments:  No specialty comments available.  PMFS History: Patient Active Problem List   Diagnosis Date Noted   Closed fracture of neck of right talus with  nonunion 09/03/2022   History reviewed. No pertinent past medical history.  History reviewed. No pertinent family history.  Past Surgical History:  Procedure Laterality Date   APPENDECTOMY     CHOLECYSTECTOMY     TUBAL LIGATION     Social History   Occupational History   Not on file  Tobacco Use   Smoking status: Every Day    Current packs/day: 1.00    Types: Cigarettes   Smokeless tobacco: Never  Substance and Sexual Activity   Alcohol use: Not Currently   Drug use: Yes    Types: Methamphetamines    Comment: last used nov 2021   Sexual activity: Not on file

## 2022-12-18 ENCOUNTER — Encounter (INDEPENDENT_AMBULATORY_CARE_PROVIDER_SITE_OTHER): Payer: Self-pay

## 2023-01-08 ENCOUNTER — Other Ambulatory Visit: Payer: Self-pay

## 2023-01-08 ENCOUNTER — Encounter (HOSPITAL_COMMUNITY): Payer: Self-pay | Admitting: Orthopedic Surgery

## 2023-01-08 NOTE — Progress Notes (Signed)
PCP - Medicine, [facility] Internal  Cardiologist -   PPM/ICD - denies Device Orders - n/a Rep Notified - n/a  Chest x-ray - 07-02-21 EKG - 07-02-21 Stress Test - denies ECHO - denies Cardiac Cath - denies  CPAP - denies  Dm denies  Blood Thinner Instructions: denies Aspirin Instructions: n/a  ERAS Protcol - clear liquids until 5:35  COVID TEST- n/a  Anesthesia review: no  Patient verbally denies any shortness of breath, fever, cough and chest pain during phone call   -------------  SDW INSTRUCTIONS given:  Your procedure is scheduled on January 10, 2023.  Report to [facility] Main Entrance "A" at 6:05 A.M., and check in at the Admitting office.  Call this number if you have problems the morning of surgery:  [phone]   Remember:  Do not eat after midnight the night before your surgery  You may drink clear liquids until 5:35 the morning of your surgery.   Clear liquids allowed are: Water, Non-Citrus Juices (without pulp), Carbonated Beverages, Clear Tea, Black Coffee Only, and Gatorade    Take these medicines the morning of surgery with A SIP OF WATER n/a  As of today, STOP taking any Aspirin (unless otherwise instructed by your surgeon) Aleve, Naproxen, Ibuprofen, Motrin, Advil, Goody's, BC's, all herbal medications, fish oil, and all vitamins.                      Do not wear jewelry, make up, or nail polish            Do not wear lotions, powders, perfumes/colognes, or deodorant.            Do not shave 48 hours prior to surgery.  Men may shave face and neck.            Do not bring valuables to the hospital.            [facility] is not responsible for any belongings or valuables.  Do NOT Smoke (Tobacco/Vaping) 24 hours prior to your procedure If you use a CPAP at night, you may bring all equipment for your overnight stay.   Contacts, glasses, dentures or bridgework may not be worn into surgery.      For patients admitted to the hospital, discharge time  will be determined by your treatment team.   Patients discharged the day of surgery will not be allowed to drive home, and someone needs to stay with them for 24 hours.    Special instructions:   [place]- Preparing For Surgery  Before surgery, you can play an important role. Because skin is not sterile, your skin needs to be as free of germs as possible. You can reduce the number of germs on your skin by washing with CHG (chlorahexidine gluconate) Soap before surgery.  CHG is an antiseptic cleaner which kills germs and bonds with the skin to continue killing germs even after washing.    Oral Hygiene is also important to reduce your risk of infection.  Remember - BRUSH YOUR TEETH THE MORNING OF SURGERY WITH YOUR REGULAR TOOTHPASTE  Please do not use if you have an allergy to CHG or antibacterial soaps. If your skin becomes reddened/irritated stop using the CHG.  Do not shave (including legs and underarms) for at least 48 hours prior to first CHG shower. It is OK to shave your face.  Please follow these instructions carefully.   Shower the NIGHT BEFORE SURGERY and the MORNING OF SURGERY with  DIAL Soap.   Pat yourself dry with a CLEAN TOWEL.  Wear CLEAN PAJAMAS to bed the night before surgery  Place CLEAN SHEETS on your bed the night of your first shower and DO NOT SLEEP WITH PETS.   Day of Surgery: Please shower morning of surgery  Wear Clean/Comfortable clothing the morning of surgery Do not apply any deodorants/lotions.   Remember to brush your teeth WITH YOUR REGULAR TOOTHPASTE.   Questions were answered. Patient verbalized understanding of instructions.

## 2023-01-10 ENCOUNTER — Other Ambulatory Visit: Payer: Self-pay

## 2023-01-10 ENCOUNTER — Ambulatory Visit (HOSPITAL_BASED_OUTPATIENT_CLINIC_OR_DEPARTMENT_OTHER): Payer: Medicaid Other | Admitting: Anesthesiology

## 2023-01-10 ENCOUNTER — Ambulatory Visit (HOSPITAL_COMMUNITY): Payer: Medicaid Other

## 2023-01-10 ENCOUNTER — Encounter (HOSPITAL_COMMUNITY)
Admission: RE | Disposition: A | Discharge status: Home / Self Care | Payer: Self-pay | Source: Home / Self Care | Attending: Orthopedic Surgery

## 2023-01-10 ENCOUNTER — Ambulatory Visit (HOSPITAL_COMMUNITY): Payer: Medicaid Other | Admitting: Anesthesiology

## 2023-01-10 ENCOUNTER — Encounter (HOSPITAL_COMMUNITY): Payer: Self-pay | Admitting: Orthopedic Surgery

## 2023-01-10 ENCOUNTER — Encounter: Payer: Self-pay | Admitting: Orthopedic Surgery

## 2023-01-10 ENCOUNTER — Ambulatory Visit (HOSPITAL_COMMUNITY)
Admission: RE | Admit: 2023-01-10 | Discharge: 2023-01-10 | Disposition: A | Discharge status: Home / Self Care | Payer: Medicaid Other | Attending: Orthopedic Surgery | Admitting: Orthopedic Surgery

## 2023-01-10 DIAGNOSIS — S92111K Displaced fracture of neck of right talus, subsequent encounter for fracture with nonunion: Secondary | ICD-10-CM | POA: Diagnosis not present

## 2023-01-10 DIAGNOSIS — Z9049 Acquired absence of other specified parts of digestive tract: Secondary | ICD-10-CM | POA: Diagnosis not present

## 2023-01-10 DIAGNOSIS — M12571 Traumatic arthropathy, right ankle and foot: Secondary | ICD-10-CM | POA: Insufficient documentation

## 2023-01-10 DIAGNOSIS — M19071 Primary osteoarthritis, right ankle and foot: Secondary | ICD-10-CM | POA: Diagnosis not present

## 2023-01-10 DIAGNOSIS — F17200 Nicotine dependence, unspecified, uncomplicated: Secondary | ICD-10-CM | POA: Diagnosis not present

## 2023-01-10 HISTORY — DX: Unspecified osteoarthritis, unspecified site: M19.90

## 2023-01-10 HISTORY — PX: FOOT ARTHRODESIS: SHX1655

## 2023-01-10 LAB — CBC
HCT: 45.6 % (ref 36.0–46.0)
Hemoglobin: 14.5 g/dL (ref 12.0–15.0)
MCH: 30.5 pg (ref 26.0–34.0)
MCHC: 31.8 g/dL (ref 30.0–36.0)
MCV: 96 fL (ref 80.0–100.0)
Platelets: 239 10*3/uL (ref 150–400)
RBC: 4.75 MIL/uL (ref 3.87–5.11)
RDW: 11.9 % (ref 11.5–15.5)
WBC: 7.3 10*3/uL (ref 4.0–10.5)
nRBC: 0 % (ref 0.0–0.2)

## 2023-01-10 LAB — BASIC METABOLIC PANEL
Anion gap: 10 (ref 5–15)
BUN: 17 mg/dL (ref 6–20)
CO2: 23 mmol/L (ref 22–32)
Calcium: 9.2 mg/dL (ref 8.9–10.3)
Chloride: 105 mmol/L (ref 98–111)
Creatinine, Ser: 0.9 mg/dL (ref 0.44–1.00)
GFR, Estimated: >60 mL/min (ref >60)
Glucose, Bld: 83 mg/dL (ref 70–99)
Potassium: 4 mmol/L (ref 3.5–5.1)
Sodium: 138 mmol/L (ref 135–145)

## 2023-01-10 LAB — POCT PREGNANCY, URINE: Preg Test, Ur: NEGATIVE

## 2023-01-10 SURGERY — FUSION, JOINT, FOOT
Anesthesia: General | Site: Foot | Laterality: Right

## 2023-01-10 MED ORDER — PROPOFOL 10 MG/ML IV BOLUS
INTRAVENOUS | Status: DC | PRN
  Administered 2023-01-10: 200 mg via INTRAVENOUS

## 2023-01-10 MED ORDER — OXYCODONE-ACETAMINOPHEN 5-325 MG PO TABS: 1.0000 | ORAL_TABLET | ORAL | 0 refills | Status: DC | PRN

## 2023-01-10 MED ORDER — PROPOFOL 10 MG/ML IV BOLUS
INTRAVENOUS | Status: AC
  Filled 2023-01-10: qty 20

## 2023-01-10 MED ORDER — LIDOCAINE 2% (20 MG/ML) 5 ML SYRINGE
INTRAMUSCULAR | Status: DC | PRN
  Administered 2023-01-10: 60 mg via INTRAVENOUS

## 2023-01-10 MED ORDER — ONDANSETRON HCL 4 MG/2ML IJ SOLN
INTRAMUSCULAR | Status: AC
  Filled 2023-01-10: qty 2

## 2023-01-10 MED ORDER — FENTANYL CITRATE (PF) 100 MCG/2ML IJ SOLN
INTRAMUSCULAR | Status: AC
  Administered 2023-01-10: 100 ug via INTRAVENOUS
  Filled 2023-01-10: qty 2

## 2023-01-10 MED ORDER — DEXAMETHASONE SODIUM PHOSPHATE 10 MG/ML IJ SOLN
INTRAMUSCULAR | Status: DC | PRN
  Administered 2023-01-10: 10 mg via INTRAVENOUS

## 2023-01-10 MED ORDER — ONDANSETRON HCL 4 MG/2ML IJ SOLN
INTRAMUSCULAR | Status: DC | PRN
  Administered 2023-01-10: 4 mg via INTRAVENOUS

## 2023-01-10 MED ORDER — DEXAMETHASONE SODIUM PHOSPHATE 10 MG/ML IJ SOLN
INTRAMUSCULAR | Status: AC
  Filled 2023-01-10: qty 1

## 2023-01-10 MED ORDER — 0.9 % SODIUM CHLORIDE (POUR BTL) OPTIME
TOPICAL | Status: DC | PRN
  Administered 2023-01-10: 1000 mL

## 2023-01-10 MED ORDER — CEFAZOLIN SODIUM-DEXTROSE 2-4 GM/100ML-% IV SOLN
2.0000 g | INTRAVENOUS | Status: AC
  Administered 2023-01-10: 2 g via INTRAVENOUS
  Filled 2023-01-10: qty 100

## 2023-01-10 MED ORDER — MIDAZOLAM HCL 2 MG/2ML IJ SOLN
INTRAMUSCULAR | Status: AC
  Administered 2023-01-10: 2 mg via INTRAVENOUS
  Filled 2023-01-10: qty 2

## 2023-01-10 MED ORDER — ORAL CARE MOUTH RINSE: 15.0000 mL | Freq: Once | OROMUCOSAL | Status: AC

## 2023-01-10 MED ORDER — ROPIVACAINE HCL 5 MG/ML IJ SOLN
INTRAMUSCULAR | Status: DC | PRN
  Administered 2023-01-10: 30 mL via PERINEURAL

## 2023-01-10 MED ORDER — LIDOCAINE 2% (20 MG/ML) 5 ML SYRINGE
INTRAMUSCULAR | Status: AC
  Filled 2023-01-10: qty 5

## 2023-01-10 MED ORDER — FENTANYL CITRATE (PF) 100 MCG/2ML IJ SOLN: 100.0000 ug | Freq: Once | INTRAMUSCULAR | Status: AC

## 2023-01-10 MED ORDER — MIDAZOLAM HCL 2 MG/2ML IJ SOLN: 2.0000 mg | Freq: Once | INTRAMUSCULAR | Status: AC

## 2023-01-10 MED ORDER — LACTATED RINGERS IV SOLN
INTRAVENOUS | Status: DC | PRN
Start: 2023-01-10 — End: 2023-01-10

## 2023-01-10 MED ORDER — CHLORHEXIDINE GLUCONATE 0.12 % MT SOLN
15.0000 mL | Freq: Once | OROMUCOSAL | Status: AC
  Administered 2023-01-10: 15 mL via OROMUCOSAL
  Filled 2023-01-10: qty 15

## 2023-01-10 MED ORDER — LACTATED RINGERS IV SOLN: INTRAVENOUS | Status: DC

## 2023-01-10 MED ORDER — FENTANYL CITRATE (PF) 250 MCG/5ML IJ SOLN
INTRAMUSCULAR | Status: AC
  Filled 2023-01-10: qty 5

## 2023-01-10 SURGICAL SUPPLY — 50 items
BAG COUNTER SPONGE SURGICOUNT (BAG) ×2 IMPLANT
BAG SPNG CNTER NS LX DISP (BAG) ×1
BANDAGE ESMARK 6X9 LF (GAUZE/BANDAGES/DRESSINGS) IMPLANT
BIT DRILL 4.5 TYB 9 (BIT) IMPLANT
BLADE SAW SGTL HD 18.5X60.5X1. (BLADE) ×2 IMPLANT
BLADE SURG 10 STRL SS (BLADE) IMPLANT
BNDG CMPR 5X4 CHSV STRCH STRL (GAUZE/BANDAGES/DRESSINGS) ×1
BNDG CMPR 9X6 STRL LF SNTH (GAUZE/BANDAGES/DRESSINGS) ×1
BNDG COHESIVE 4X5 TAN STRL (GAUZE/BANDAGES/DRESSINGS) ×2 IMPLANT
BNDG COHESIVE 4X5 TAN STRL LF (GAUZE/BANDAGES/DRESSINGS) IMPLANT
BNDG ESMARK 6X9 LF (GAUZE/BANDAGES/DRESSINGS) ×1
BNDG GAUZE DERMACEA FLUFF 4 (GAUZE/BANDAGES/DRESSINGS) ×4 IMPLANT
BNDG GZE DERMACEA 4 6PLY (GAUZE/BANDAGES/DRESSINGS) ×1
BUR EGG ELITE 4.0 (BURR) IMPLANT
COTTON STERILE ROLL (GAUZE/BANDAGES/DRESSINGS) ×2 IMPLANT
COVER MAYO STAND STRL (DRAPES) IMPLANT
COVER SURGICAL LIGHT HANDLE (MISCELLANEOUS) ×4 IMPLANT
DRAPE INCISE IOBAN 66X45 STRL (DRAPES) ×2 IMPLANT
DRAPE OEC MINIVIEW 54X84 (DRAPES) IMPLANT
DRAPE U-SHAPE 47X51 STRL (DRAPES) ×2 IMPLANT
DRSG ADAPTIC 3X8 NADH LF (GAUZE/BANDAGES/DRESSINGS) ×2 IMPLANT
DURAPREP 26ML APPLICATOR (WOUND CARE) ×2 IMPLANT
ELECT REM PT RETURN 9FT ADLT (ELECTROSURGICAL) ×1
ELECTRODE REM PT RTRN 9FT ADLT (ELECTROSURGICAL) ×2 IMPLANT
GAUZE PAD ABD 8X10 STRL (GAUZE/BANDAGES/DRESSINGS) IMPLANT
GAUZE SPONGE 4X4 12PLY STRL (GAUZE/BANDAGES/DRESSINGS) ×2 IMPLANT
GLOVE BIOGEL PI IND STRL 9 (GLOVE) ×2 IMPLANT
GLOVE SURG ORTHO 9.0 STRL STRW (GLOVE) ×2 IMPLANT
GOWN STRL REUS W/ TWL XL LVL3 (GOWN DISPOSABLE) ×6 IMPLANT
GOWN STRL REUS W/TWL XL LVL3 (GOWN DISPOSABLE) ×2
GUIDEWIRE TYB 2X9 (WIRE) IMPLANT
KIT BASIN OR (CUSTOM PROCEDURE TRAY) ×2 IMPLANT
KIT TURNOVER KIT B (KITS) ×2 IMPLANT
MANIFOLD NEPTUNE II (INSTRUMENTS) ×2 IMPLANT
MARKER SKIN DUAL TIP RULER LAB (MISCELLANEOUS) IMPLANT
NS IRRIG 1000ML POUR BTL (IV SOLUTION) ×2 IMPLANT
PACK ORTHO EXTREMITY (CUSTOM PROCEDURE TRAY) ×2 IMPLANT
PAD ARMBOARD 7.5X6 YLW CONV (MISCELLANEOUS) ×4 IMPLANT
PAD CAST 4YDX4 CTTN HI CHSV (CAST SUPPLIES) ×2 IMPLANT
PADDING CAST COTTON 4X4 STRL (CAST SUPPLIES)
PUTTY DBM STAGRAFT PLUS 2CC (Putty) IMPLANT
SCREW CANN HDLS SHRT 6.5X75 (Screw) IMPLANT
SCREW CANN HDLS SHT 6.5X70 (Screw) IMPLANT
SPONGE T-LAP 18X18 ~~LOC~~+RFID (SPONGE) ×2 IMPLANT
SUCTION TUBE FRAZIER 10FR DISP (SUCTIONS) ×2 IMPLANT
SUT ETHILON 2 0 PSLX (SUTURE) ×6 IMPLANT
TOWEL GREEN STERILE (TOWEL DISPOSABLE) ×2 IMPLANT
TOWEL GREEN STERILE FF (TOWEL DISPOSABLE) ×2 IMPLANT
TUBE CONNECTING 12X1/4 (SUCTIONS) ×2 IMPLANT
WATER STERILE IRR 1000ML POUR (IV SOLUTION) ×2 IMPLANT

## 2023-01-10 NOTE — Transfer of Care (Signed)
Immediate Anesthesia Transfer of Care Note  Patient: Christie Nicholson  Procedure(s) Performed: RIGHT SUBTALAR FUSION (Right: Foot)  Patient Location: PACU  Anesthesia Type:GA combined with regional for post-op pain   Level of Consciousness: awake, alert , and oriented  Airway & Oxygen Therapy: Patient Spontanous Breathing and Patient connected to face mask oxygen  Post-op Assessment: Report given to RN and Post -op Vital signs reviewed and stable  Post vital signs: Reviewed and stable  Last Vitals:  Vitals Value Taken Time  BP 128/85 01/10/23 1045  Temp 36.4 C 01/10/23 1042  Pulse 83 01/10/23 1049  Resp 15 01/10/23 1049  SpO2 97 % 01/10/23 1049  Vitals shown include unfiled device data.  Last Pain:  Vitals:   01/10/23 1042  TempSrc:   PainSc: 0-No pain      Patients Stated Pain Goal: 0 (01/10/23 0813)  Complications: No notable events documented.

## 2023-01-10 NOTE — Anesthesia Preprocedure Evaluation (Signed)
Anesthesia Evaluation  Patient identified by MRN, date of birth, ID band Patient awake    Reviewed: Allergy & Precautions, H&P , NPO status , Patient's Chart, lab work & pertinent test results  Airway Mallampati: II  TM Distance: >3 FB Neck ROM: Full    Dental no notable dental hx.    Pulmonary neg pulmonary ROS, Current Smoker and Patient abstained from smoking.   Pulmonary exam normal breath sounds clear to auscultation       Cardiovascular negative cardio ROS Normal cardiovascular exam Rhythm:Regular Rate:Normal     Neuro/Psych negative neurological ROS  negative psych ROS   GI/Hepatic negative GI ROS, Neg liver ROS,,,  Endo/Other  negative endocrine ROS    Renal/GU negative Renal ROS  negative genitourinary   Musculoskeletal  (+) Arthritis , Osteoarthritis,    Abdominal   Peds negative pediatric ROS (+)  Hematology negative hematology ROS (+)   Anesthesia Other Findings   Reproductive/Obstetrics negative OB ROS                             Anesthesia Physical Anesthesia Plan  ASA: 2  Anesthesia Plan: General   Post-op Pain Management: Regional block*   Induction: Intravenous  PONV Risk Score and Plan: 2 and Ondansetron, Midazolam and Treatment may vary due to age or medical condition  Airway Management Planned: LMA  Additional Equipment:   Intra-op Plan:   Post-operative Plan: Extubation in OR  Informed Consent: I have reviewed the patients History and Physical, chart, labs and discussed the procedure including the risks, benefits and alternatives for the proposed anesthesia with the patient or authorized representative who has indicated his/her understanding and acceptance.     Dental advisory given  Plan Discussed with: CRNA  Anesthesia Plan Comments:        Anesthesia Quick Evaluation

## 2023-01-10 NOTE — Interval H&P Note (Signed)
History and Physical Interval Note:  01/10/2023 9:31 AM  Christie Nicholson  has presented today for surgery, with the diagnosis of Subtalar Arthritis.  The various methods of treatment have been discussed with the patient and family. After consideration of risks, benefits and other options for treatment, the patient has consented to  Procedure(s): RIGHT SUBTALAR FUSION (Right) as a surgical intervention.  The patient's history has been reviewed, patient examined, no change in status, stable for surgery.  I have reviewed the patient's chart and labs.  Questions were answered to the patient's satisfaction.     Nadara Mustard

## 2023-01-10 NOTE — Anesthesia Procedure Notes (Signed)
Procedure Name: LMA Insertion Date/Time: 01/10/2023 9:42 AM  Performed by: Allyn Kenner, CRNAPre-anesthesia Checklist: Patient identified, Emergency Drugs available, Suction available and Patient being monitored Patient Re-evaluated:Patient Re-evaluated prior to induction Oxygen Delivery Method: Circle System Utilized Preoxygenation: Pre-oxygenation with 100% oxygen Induction Type: IV induction Ventilation: Mask ventilation without difficulty LMA: LMA inserted LMA Size: 4.0 Number of attempts: 1 Airway Equipment and Method: Bite block Placement Confirmation: positive ETCO2 Tube secured with: Tape Dental Injury: Teeth and Oropharynx as per pre-operative assessment

## 2023-01-10 NOTE — Anesthesia Procedure Notes (Signed)
Anesthesia Regional Block: Popliteal block   Pre-Anesthetic Checklist: , timeout performed,  Correct Patient, Correct Site, Correct Laterality,  Correct Procedure, Correct Position, site marked,  Risks and benefits discussed,  Surgical consent,  Pre-op evaluation,  At surgeon's request and post-op pain management  Laterality: Right  Prep: chloraprep       Needles:  Injection technique: Single-shot  Needle Type: Stimiplex     Needle Length: 9cm  Needle Gauge: 21     Additional Needles:   Procedures:,,,, ultrasound used (permanent image in chart),,    Narrative:  Start time: 01/10/2023 7:54 AM End time: 01/10/2023 7:59 AM Injection made incrementally with aspirations every 5 mL.  Performed by: Personally  Anesthesiologist: Lowella Curb, MD

## 2023-01-10 NOTE — Op Note (Signed)
01/10/2023  10:39 AM  PATIENT:  Christie Nicholson    PRE-OPERATIVE DIAGNOSIS:  Subtalar Arthritis Secondary to talar neck fracture POST-OPERATIVE DIAGNOSIS:  Same  PROCEDURE:  RIGHT SUBTALAR FUSION Application of 2 cc of stay graft. C-arm possibly to verify reduction.  SURGEON:  Nadara Mustard, MD  PHYSICIAN ASSISTANT:None ANESTHESIA:   General  PREOPERATIVE INDICATIONS:  [name] is a  47 y.o. female with a diagnosis of Subtalar Arthritis who failed conservative measures and elected for surgical management.    The risks benefits and alternatives were discussed with the patient preoperatively including but not limited to the risks of infection, bleeding, nerve injury, cardiopulmonary complications, the need for revision surgery, among others, and the patient was willing to proceed.  OPERATIVE IMPLANTS:   Implant Name Type Inv. Item Serial No. Manufacturer Lot No. LRB No. Used Action  PUTTY DBM STAGRAFT PLUS 2CC - [id number] Putty PUTTY DBM STAGRAFT PLUS 2CC  [employer]MER [employer]ON(ORTH,TRAU,BIO,SG) [id number] Right 1 Implanted  SCREW CANN HDLS SHT 6.5X70 - [id number] Screw SCREW CANN HDLS SHT 6.5X70  ZIMMER [employer]ON(ORTH,TRAU,BIO,SG)  Right 1 Implanted  SCREW CANN HDLS SHRT 6.5X75 - [id number] Screw SCREW CANN HDLS SHRT 6.5X75  [employer]IMMER [employer]ON(ORTH,TRAU,BIO,SG)  Right 1 Implanted    @ENCIMAGES @  OPERATIVE FINDINGS: See en face be verified reduction of the subtalar joint.  OPERATIVE PROCEDURE: Patient was brought the operating room after undergoing a regional anesthetic she then underwent a general anesthetic.  After adequate levels anesthesia were obtained patient's right lower extremity was prepped using DuraPrep draped into a sterile field a timeout was called.  A oblique incision was made over the sinus Tarsi this was carried down to the subtalar joint.  Retractors were placed to protect the peroneal tendons as well as the dorsal neurovascular structures.  This was carried down to the  subtalar joint and a Rampey bur was used to initiate debridement of the subtalar joint this was further debrided with a curette.  This was debrided back to bleeding viable bone.  The joint was packed with 2 cc of stay graft.  Incision was made posteriorly over the calcaneus and 2 guidewires were inserted from the calcaneus to the talar body.  C-arm fluoroscopy verified alignment.  A 70 and 75 mm screw was used to stabilize the subtalar joint.  C-arm Shrosbree verified alignment.  The wound was irrigated.  The incision was closed using 2-0 nylon a sterile dressing was applied patient was taken the PACU in stable condition.   DISCHARGE PLANNING:  Antibiotic duration: Preoperative antibiotics  Weightbearing: Nonweightbearing on the right  Pain medication: Prescription for Percocet  Dressing care/ Wound VAC: Dry dressing  Ambulatory devices: Crutches  Discharge to: Home.  Follow-up: In the office 1 week post operative.

## 2023-01-10 NOTE — H&P (Signed)
Christie Nicholson is an 47 y.o. female.   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 Patient has pain with activities of daily living she has to use a cane for ambulation. Patient states she cannot perform her normal activities secondary to pain. She has had a subtalar injection without relief.   Past Medical History:  Diagnosis Date   Arthritis     Past Surgical History:  Procedure Laterality Date   APPENDECTOMY     CHOLECYSTECTOMY     TUBAL LIGATION      History reviewed. No pertinent family history. Social History:  reports that she has been smoking cigarettes. She has never used smokeless tobacco. She reports that she does not currently use alcohol. She reports current drug use. Drug: Methamphetamines.  Allergies:  Allergies  Allergen Reactions   Ciprofloxacin Hives and Rash   Phenergan [Promethazine Hcl] Hives and Rash    Medications Prior to Admission  Medication Sig Dispense Refill   ibuprofen (ADVIL) 200 MG tablet Take 400 mg by mouth every 6 (six) hours as needed for moderate pain (pain score 4-6).      No results found for this or any previous visit (from the past 48 hour(s)). No results found.  Review of Systems  All other systems reviewed and are negative.   Height 5\' 4"  (1.626 m), weight 61.2 kg. Physical Exam  Patient is alert, oriented, no adenopathy, well-dressed, normal affect, normal respiratory effort. Examination patient has a strong palpable dorsalis pedis pulse.  She has minimal range of motion of the right ankle.  There is essentially no subtalar motion and has pain with attempted subtalar motion.  She has pain to palpation over the sinus Tarsi.  Ankle range of motion dorsiflexion 0 degrees plantarflexion 20 degrees.  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  The talar neck fracture is healed status post open duction internal fixation. Assessment/Plan 1. 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 Pain in right ankle and joints of right foot   3. 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  Patient states she would like to proceed with surgery.  Will plan for a subtalar fusion.  Risks and benefits were discussed including need for additional surgery, persistent pain, nonunion.  Patient states she understands wished to proceed at this time.  Nadara Mustard, MD 01/10/2023, 6:44 AM

## 2023-01-13 ENCOUNTER — Encounter (HOSPITAL_COMMUNITY): Payer: Self-pay | Admitting: Orthopedic Surgery

## 2023-01-13 NOTE — Anesthesia Postprocedure Evaluation (Signed)
Anesthesia Post Note  Patient: Christie Nicholson  Procedure(s) Performed: RIGHT SUBTALAR FUSION (Right: Foot)     Patient location during evaluation: PACU Anesthesia Type: General Level of consciousness: awake and alert Pain management: pain level controlled Vital Signs Assessment: post-procedure vital signs reviewed and stable Respiratory status: spontaneous breathing, nonlabored ventilation and respiratory function stable Cardiovascular status: blood pressure returned to baseline and stable Postop Assessment: no apparent nausea or vomiting Anesthetic complications: no   There were no known notable events for this encounter.  Last Vitals:  Vitals:   01/10/23 1100 01/10/23 1111  BP: 133/87 (!) 127/91  Pulse: 81 82  Resp: 19 18  Temp:  36.5 C  SpO2: 97% 98%    Last Pain:  Vitals:   01/10/23 1111  TempSrc:   PainSc: 0-No pain                 Lowella Curb

## 2023-01-17 ENCOUNTER — Other Ambulatory Visit (INDEPENDENT_AMBULATORY_CARE_PROVIDER_SITE_OTHER): Payer: Self-pay

## 2023-01-17 ENCOUNTER — Encounter: Payer: Self-pay | Admitting: Family

## 2023-01-17 ENCOUNTER — Ambulatory Visit (INDEPENDENT_AMBULATORY_CARE_PROVIDER_SITE_OTHER): Payer: Medicaid Other | Admitting: Family

## 2023-01-17 DIAGNOSIS — S92111K Displaced fracture of neck of right talus, subsequent encounter for fracture with nonunion: Secondary | ICD-10-CM

## 2023-01-17 NOTE — Progress Notes (Signed)
   Post-Op Visit Note   Patient: Christie Nicholson           Date of Birth: [birth year]           MRN: [id number] Visit Date: 01/17/2023 PCP: Medicine, [facility]  Chief Complaint: No chief complaint on file.   HPI:  HPI The patient is a 47 year old woman seen status post right subtalar fusion November 8 Ortho Exam On examination right foot the lateral and posterior incisions are well-approximated sutures there is no gaping drainage or erythema  Visit Diagnoses: No diagnosis found.  Plan: Continue nonweightbearing continue cam boot may shower may get this wet she will follow-up in 2 weeks with repeat radiographs and suture removal  Follow-Up Instructions: Return in about 1 week (around 01/24/2023).   Imaging: No results found.  Orders:  No orders of the defined types were placed in this encounter.  No orders of the defined types were placed in this encounter.    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 No pertinent family history.  Past Surgical History:  Procedure Laterality Date   APPENDECTOMY     CHOLECYSTECTOMY     FOOT ARTHRODESIS Right 01/10/2023   Procedure: RIGHT SUBTALAR FUSION;  Surgeon: Nadara Mustard, MD;  Location: [facility] OR;  Service: Orthopedics;  Laterality: Right;   TUBAL LIGATION     Social History   Occupational History   Not on file  Tobacco Use   Smoking status: Every Day    Current packs/day: 1.00    Types: Cigarettes   Smokeless tobacco: Never  Vaping Use   Vaping status: Never Used  Substance and Sexual Activity   Alcohol use: Not Currently   Drug use: Yes    Types: Methamphetamines    Comment: Used meth about a week ago   Sexual activity: Not on file

## 2023-01-23 ENCOUNTER — Encounter: Payer: Self-pay | Admitting: Orthopedic Surgery

## 2023-01-24 ENCOUNTER — Telehealth: Payer: Self-pay | Admitting: Orthopedic Surgery

## 2023-01-24 NOTE — Telephone Encounter (Signed)
Patient called back returning your call. She had to hop to the phone and didn't make it in time. CB#[phone]

## 2023-01-28 NOTE — Telephone Encounter (Signed)
Christie Nicholson pt on the phone about this problem.

## 2023-02-03 ENCOUNTER — Ambulatory Visit (INDEPENDENT_AMBULATORY_CARE_PROVIDER_SITE_OTHER): Payer: Self-pay

## 2023-02-03 ENCOUNTER — Ambulatory Visit (INDEPENDENT_AMBULATORY_CARE_PROVIDER_SITE_OTHER): Payer: Medicaid Other | Admitting: Orthopedic Surgery

## 2023-02-03 ENCOUNTER — Encounter: Payer: Self-pay | Admitting: Orthopedic Surgery

## 2023-02-03 DIAGNOSIS — Z981 Arthrodesis status: Secondary | ICD-10-CM | POA: Diagnosis not present

## 2023-02-03 NOTE — Progress Notes (Signed)
Office Visit Note   Patient: Christie Nicholson           Date of Birth: Dec 23, 1975           MRN: [id number] Visit Date: 02/03/2023              Requested by: Medicine, [facility] [address] [place],  [place] [address] PCP: Medicine, [facility]  Chief Complaint  Patient presents with   Right Foot - Routine Post Op    01/10/23 right subtalar fusion      HPI: Patient is a 47 year old woman who is 3 weeks status post right subtalar fusion she is nonweightbearing in a cam boot.  Assessment & Plan: Visit Diagnoses:  1. S/P ankle fusion     Plan: Patient will continue to minimize weightbearing on the right foot.  Continue with range of motion of the ankle Dial soap cleansing and scar massage.  At follow-up in 3 weeks repeat three-view radiographs of the right ankle and anticipate full weightbearing.  Follow-Up Instructions: Return in about 3 weeks (around 02/24/2023).   Ortho Exam  Patient is alert, oriented, no adenopathy, well-dressed, normal affect, normal respiratory effort. Examination the incision is well-healed sutures are harvested there is no cellulitis no drainage.  Imaging: XR Ankle Complete Right  Result Date: 02/03/2023 Three-view radiographs of the right ankle shows stable alignment of the subtalar fusion.  The ankle mortise is congruent no avascular changes.  No images are attached to the encounter.  Labs: No results found for: "HGBA1C", "ESRSEDRATE", "CRP", "LABURIC", "REPTSTATUS", "GRAMSTAIN", "CULT", "LABORGA"   Lab Results  Component Value Date   ALBUMIN 3.3 (L) 07/02/2021    No results found for: "MG" No results found for: "VD25OH"  No results found for: "PREALBUMIN"    Latest Ref Rng & Units 01/10/2023    6:42 AM 07/02/2021   12:10 AM  CBC EXTENDED  WBC 4.0 - 10.5 K/uL 7.3  8.2   RBC 3.87 - 5.11 MIL/uL 4.75  4.27   Hemoglobin 12.0 - 15.0 g/dL 25.3  66.4   HCT 40.3 - 46.0 % 45.6  40.5   Platelets 150 - 400 K/uL 239  263    NEUT# 1.7 - 7.7 K/uL  5.4   Lymph# 0.7 - 4.0 K/uL  1.8      There is no height or weight on file to calculate BMI.  Orders:  Orders Placed This Encounter  Procedures   XR Ankle Complete Right   No orders of the defined types were placed in this encounter.    Procedures: No procedures performed  Clinical Data: No additional findings.  ROS:  All other systems negative, except as noted in the HPI. Review of Systems  Objective: Vital Signs: There were no vitals taken for this visit.  Specialty Comments:  No specialty comments available.  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 No pertinent family history.  Past Surgical History:  Procedure Laterality Date   APPENDECTOMY     CHOLECYSTECTOMY     FOOT ARTHRODESIS Right 01/10/2023   Procedure: RIGHT SUBTALAR FUSION;  Surgeon: Nadara Mustard, MD;  Location: [facility] OR;  Service: Orthopedics;  Laterality: Right;   TUBAL LIGATION     Social History   Occupational History   Not on file  Tobacco Use  Smoking status: Every Day    Current packs/day: 1.00    Types: Cigarettes   Smokeless tobacco: Never  Vaping Use   Vaping status: Never Used  Substance and Sexual Activity   Alcohol use: Not Currently   Drug use: Yes    Types: Methamphetamines    Comment: Used meth about a week ago   Sexual activity: Not on file

## 2023-02-04 ENCOUNTER — Other Ambulatory Visit (INDEPENDENT_AMBULATORY_CARE_PROVIDER_SITE_OTHER): Payer: Self-pay | Admitting: Orthopedic Surgery

## 2023-02-11 ENCOUNTER — Other Ambulatory Visit (INDEPENDENT_AMBULATORY_CARE_PROVIDER_SITE_OTHER): Payer: Self-pay | Admitting: Orthopedic Surgery

## 2023-02-12 ENCOUNTER — Telehealth: Payer: Self-pay | Admitting: Orthopedic Surgery

## 2023-02-12 MED ORDER — OXYCODONE-ACETAMINOPHEN 5-325 MG PO TABS: 1.0000 | ORAL_TABLET | Freq: Four times a day (QID) | ORAL | 0 refills | Status: DC | PRN

## 2023-02-12 NOTE — Telephone Encounter (Signed)
Patient is s/p a right subtalar fusion 01/10/2023 requesting refill of Oxycodone 5/325 last refilled on 01/10/2023 #30

## 2023-02-12 NOTE — Telephone Encounter (Signed)
Patient called needing Rx refilled Percocet. The number to contact patient is [phone]

## 2023-03-03 ENCOUNTER — Encounter: Payer: Medicaid Other | Admitting: Orthopedic Surgery

## 2023-03-04 ENCOUNTER — Other Ambulatory Visit: Payer: Self-pay | Admitting: Family

## 2023-03-04 MED ORDER — OXYCODONE-ACETAMINOPHEN 5-325 MG PO TABS: 1.0000 | ORAL_TABLET | Freq: Three times a day (TID) | ORAL | 0 refills | Status: DC | PRN

## 2023-03-07 ENCOUNTER — Other Ambulatory Visit: Payer: Self-pay | Admitting: Medical Genetics

## 2023-03-17 ENCOUNTER — Other Ambulatory Visit: Payer: Self-pay | Admitting: Orthopedic Surgery

## 2023-03-17 ENCOUNTER — Encounter: Payer: Self-pay | Admitting: Orthopedic Surgery

## 2023-03-17 ENCOUNTER — Other Ambulatory Visit (INDEPENDENT_AMBULATORY_CARE_PROVIDER_SITE_OTHER): Payer: Medicaid Other

## 2023-03-17 ENCOUNTER — Telehealth: Payer: Self-pay | Admitting: Family

## 2023-03-17 ENCOUNTER — Ambulatory Visit (INDEPENDENT_AMBULATORY_CARE_PROVIDER_SITE_OTHER): Payer: Medicaid Other | Admitting: Orthopedic Surgery

## 2023-03-17 DIAGNOSIS — M25571 Pain in right ankle and joints of right foot: Secondary | ICD-10-CM

## 2023-03-17 DIAGNOSIS — Z981 Arthrodesis status: Secondary | ICD-10-CM

## 2023-03-17 MED ORDER — OXYCODONE-ACETAMINOPHEN 5-325 MG PO TABS: 1.0000 | ORAL_TABLET | Freq: Three times a day (TID) | ORAL | 0 refills | Status: AC | PRN

## 2023-03-17 NOTE — Progress Notes (Signed)
 Office Visit Note   Patient: Christie Nicholson           Date of Birth: 05-09-1975           MRN: [id number] Visit Date: 03/17/2023              Requested by: Medicine, [facility] [address] [place],  [place] [address] PCP: Medicine, [facility]  Chief Complaint  Patient presents with   Right Ankle - Routine Post Op    01/10/2023 right ankle subtalar fusion       HPI: Patient is a 48 year old woman who is 4 weeks status post right subtalar fusion.  She is currently ambulating in a fracture boot with crutches.  Patient has been independently ambulating short distances without problem.  Assessment & Plan: Visit Diagnoses:  1. Pain in right ankle and joints of right foot   2. S/P ankle fusion     Plan: Patient will advance to regular shoewear continue with the crutches increase activities as tolerated.  Three-view radiographs of the right foot at follow-up.  Follow-Up Instructions: Return in about 4 weeks (around 04/14/2023).   Ortho Exam  Patient is alert, oriented, no adenopathy, well-dressed, normal affect, normal respiratory effort. Examination the incisions are well-healed she has good dorsiflexion of the ankle patient will work on continued dorsiflexion stretching.  She has some numbness dorsal laterally over the foot.  She will Christie Nicholson on scar massage.  She states she feels 100% better than she did before surgery.  Imaging: XR Ankle Complete Right Result Date: 03/17/2023 Three-view radiographs of the right foot shows stable subtalar fusion.  No prominent hardware.  No images are attached to the encounter.  Labs: No results found for: HGBA1C, ESRSEDRATE, CRP, LABURIC, REPTSTATUS, GRAMSTAIN, CULT, LABORGA   Lab Results  Component Value Date   ALBUMIN 3.3 (L) 07/02/2021    No results found for: MG No results found for: VD25OH  No results found for: PREALBUMIN    Latest Ref Rng & Units 01/10/2023    6:42 AM 07/02/2021    12:10 AM  CBC EXTENDED  WBC 4.0 - 10.5 K/uL 7.3  8.2   RBC 3.87 - 5.11 MIL/uL 4.75  4.27   Hemoglobin 12.0 - 15.0 g/dL 85.4  86.9   HCT 63.9 - 46.0 % 45.6  40.5   Platelets 150 - 400 K/uL 239  263   NEUT# 1.7 - 7.7 K/uL  5.4   Lymph# 0.7 - 4.0 K/uL  1.8      There is no height or weight on file to calculate BMI.  Orders:  Orders Placed This Encounter  Procedures   XR Ankle Complete Right   No orders of the defined types were placed in this encounter.    Procedures: No procedures performed  Clinical Data: No additional findings.  ROS:  All other systems negative, except as noted in the HPI. Review of Systems  Objective: Vital Signs: There were no vitals taken for this visit.  Specialty Comments:  No specialty comments available.  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 No pertinent family history.  Past Surgical History:  Procedure Laterality Date   APPENDECTOMY     CHOLECYSTECTOMY     FOOT ARTHRODESIS Right 01/10/2023   Procedure: RIGHT SUBTALAR FUSION;  Surgeon: Harden Lame  V, MD;  Location: [facility] OR;  Service: Orthopedics;  Laterality: Right;   TUBAL LIGATION     Social History   Occupational History   Not on file  Tobacco Use   Smoking status: Every Day    Current packs/day: 1.00    Types: Cigarettes   Smokeless tobacco: Never  Vaping Use   Vaping status: Never Used  Substance and Sexual Activity   Alcohol use: Not Currently   Drug use: Yes    Types: Methamphetamines    Comment: Used meth about a week ago   Sexual activity: Not on file

## 2023-03-17 NOTE — Telephone Encounter (Signed)
 Patient called. She would like oxycodone called in for her.

## 2023-03-17 NOTE — Telephone Encounter (Signed)
 Pt was in office today s/p right subtalar fusion 01/10/2023. Requesting refill of Oxycodone last refill was 03/04/2023 #15 please advise.

## 2023-03-19 ENCOUNTER — Other Ambulatory Visit: Payer: Self-pay | Admitting: Family

## 2023-03-19 MED ORDER — OXYCODONE-ACETAMINOPHEN 5-325 MG PO TABS: 1.0000 | ORAL_TABLET | Freq: Two times a day (BID) | ORAL | 0 refills | Status: AC | PRN

## 2023-03-24 ENCOUNTER — Other Ambulatory Visit (HOSPITAL_COMMUNITY): Payer: Self-pay | Attending: Medical Genetics

## 2023-03-31 ENCOUNTER — Other Ambulatory Visit: Payer: Self-pay | Admitting: Orthopedic Surgery

## 2023-04-14 ENCOUNTER — Ambulatory Visit: Payer: Medicaid Other | Admitting: Orthopedic Surgery

## 2023-04-15 ENCOUNTER — Ambulatory Visit: Payer: Medicaid Other | Admitting: Orthopedic Surgery

## 2023-04-24 ENCOUNTER — Ambulatory Visit (INDEPENDENT_AMBULATORY_CARE_PROVIDER_SITE_OTHER): Payer: Medicare Other | Admitting: Orthopedic Surgery

## 2023-04-24 ENCOUNTER — Other Ambulatory Visit (INDEPENDENT_AMBULATORY_CARE_PROVIDER_SITE_OTHER): Payer: Medicare Other

## 2023-04-24 DIAGNOSIS — Z981 Arthrodesis status: Secondary | ICD-10-CM | POA: Diagnosis not present

## 2023-04-25 ENCOUNTER — Encounter: Payer: Self-pay | Admitting: Orthopedic Surgery

## 2023-04-25 NOTE — Progress Notes (Signed)
 Office Visit Note   Patient: Christie Nicholson           Date of Birth: 02-04-1976           MRN: [id number] Visit Date: 04/24/2023              Requested by: Medicine, [facility] [address] [place],  [place] [address] PCP: Medicine, [facility]  Chief Complaint  Patient presents with   Right Ankle - Follow-up    01/10/23 right ankle subtalar fusion      HPI: Patient is a 48 year old woman who is over 3 months out from a right subtalar fusion.  Patient is currently in a fracture boot and crutches.  Patient states she does have occasional pain over the posterior aspect of her heel.  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  Assessment & Plan: Visit Diagnoses:  1. S/P ankle fusion     Plan: Recommended Achilles stretching this was demonstrated and recommended toe raising for intrinsic strengthening.  Recommended wool socks for the athlete's foot.  Follow-Up Instructions: Return in about 4 weeks (around 05/22/2023).   Ortho Exam  Patient is alert, oriented, no adenopathy, well-dressed, normal affect, normal respiratory effort. Examination patient has a stable subtalar fusion.  She has good range of motion of the ankle.  Patient has point tenderness to palpation over the entry site for the subtalar fusion screws.  This appears to be scar tissue related and recommended scar massage.  There is no redness or cellulitis.  Patient was given instructions for Achilles stretching this was demonstrated and also recommended toe raises for intrinsic strengthening.  Imaging: XR Ankle Complete Right Result Date: 04/25/2023 Three-view radiographs of the right ankle shows a congruent mortise.  The subtalar joint is fused without hardware failure.  No images are attached to the encounter.  Labs: No results found for: "HGBA1C", "ESRSEDRATE", "CRP", "LABURIC", "REPTSTATUS", "GRAMSTAIN", "CULT", "LABORGA"   Lab Results  Component Value Date   ALBUMIN 3.3 (L)  07/02/2021    No results found for: "MG" No results found for: "VD25OH"  No results found for: "PREALBUMIN"    Latest Ref Rng & Units 01/10/2023    6:42 AM 07/02/2021   12:10 AM  CBC EXTENDED  WBC 4.0 - 10.5 K/uL 7.3  8.2   RBC 3.87 - 5.11 MIL/uL 4.75  4.27   Hemoglobin 12.0 - 15.0 g/dL 19.1  47.8   HCT 29.5 - 46.0 % 45.6  40.5   Platelets 150 - 400 K/uL 239  263   NEUT# 1.7 - 7.7 K/uL  5.4   Lymph# 0.7 - 4.0 K/uL  1.8      There is no height or weight on file to calculate BMI.  Orders:  Orders Placed This Encounter  Procedures   XR Ankle Complete Right   No orders of the defined types were placed in this encounter.    Procedures: No procedures performed  Clinical Data: No additional findings.  ROS:  All other systems negative, except as noted in the HPI. Review of Systems  Objective: Vital Signs: There were no vitals taken for this visit.  Specialty Comments:  No specialty comments available.  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  Past Surgical History:  Procedure Laterality Date  APPENDECTOMY     CHOLECYSTECTOMY     FOOT ARTHRODESIS Right 01/10/2023   Procedure: RIGHT SUBTALAR FUSION;  Surgeon: Nadara Mustard, MD;  Location: [facility] OR;  Service: Orthopedics;  Laterality: Right;   TUBAL LIGATION     Social History   Occupational History   Not on file  Tobacco Use   Smoking status: Every Day    Current packs/day: 1.00    Types: Cigarettes   Smokeless tobacco: Never  Vaping Use   Vaping status: Never Used  Substance and Sexual Activity   Alcohol use: Not Currently   Drug use: Yes    Types: Methamphetamines    Comment: Used meth about a week ago   Sexual activity: Not on file

## 2023-05-22 ENCOUNTER — Ambulatory Visit: Payer: Medicare Other | Admitting: Orthopedic Surgery

## 2023-06-09 ENCOUNTER — Ambulatory Visit: Admitting: Orthopedic Surgery

## 2023-06-23 ENCOUNTER — Ambulatory Visit: Admitting: Orthopedic Surgery

## 2023-06-30 ENCOUNTER — Ambulatory Visit: Admitting: Orthopedic Surgery

## 2023-07-03 ENCOUNTER — Ambulatory Visit: Admitting: Orthopedic Surgery

## 2023-11-26 NOTE — Progress Notes (Signed)
 Assessment and Plan:   Assessment & Plan Bronchitis  Acute cough, nasal congestion, and wheezing Cough, nasal congestion, and wheezing for two months, suggestive of bronchitis or sinus infection. Wheezing noted. Differential includes bronchitis and sinus infection. - Administer breathing treatment in office. - Prescribe inhaler if breathing treatment is effective. - Prescribe steroid taper if breathing treatment is effective. - Consider antibiotics based on clinical assessment post-treatment.   Orders: .  albuterol  2.5 mg /3 mL (0.083 %) nebulizer solution 2.5 mg .  predniSONE (DELTASONE) 10 MG tablet; Take 6 tablets (60 mg total) by mouth daily for 1 day, THEN 5 tablets (50 mg total) daily for 1 day, THEN 4 tablets (40 mg total) daily for 1 day, THEN 3 tablets (30 mg total) daily for 1 day, THEN 2 tablets (20 mg total) daily for 1 day, THEN 1 tablet (10 mg total) daily for 1 day. .  albuterol  HFA 90 mcg/actuation inhaler; Inhale 2 puffs every four (4) hours as needed. SABRA  amoxicillin (AMOXIL) 500 MG capsule; Take 1 capsule (500 mg total) by mouth every twelve (12) hours for 10 days.  Throat pain  Orders: .  POCT Rapid Strep A Screen - RN Obtain .  POCT SARS/FLU/RSV - RN OBTAIN  Upper respiratory tract infection, unspecified type  Orders: .  amoxicillin (AMOXIL) 500 MG capsule; Take 1 capsule (500 mg total) by mouth every twelve (12) hours for 10 days.      The following portions of the patient's history were reviewed and updated as appropriate: allergies, current medications, past family history, past medical history, past social history, past surgical history and problem list.   Subjective:   Patient ID: Christie Nicholson is a 48 y.o. female Chief Complaint  Patient presents with  . Sore Throat  . Cough  . Nasal Congestion  . 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 Patient has been taking Nyquil.      History of Present Illness Christie Nicholson is a 48 year old female who presents with cough, nasal congestion, and difficulty breathing for two months.She has a persistent, non-productive 'barking' cough and nasal congestion. She experiences difficulty breathing, described as 'breathing through sandpaper'. There is no sore throat, but she has sinus pressure pain and some ear pain. She experiences mild diarrhea without nausea or vomiting. She feels hot, which she attributes to possible premenopausal symptoms. She has been taking Nyquil for symptom management. She recalls a past breathing treatment but has not had one recently.    Sore Throat This is a new problem. The current episode started more than 1 month ago. The problem occurs intermittently. The problem has been unchanged. Associated symptoms include fatigue. Nothing aggravates the symptoms.  Cough This is a new problem. The current episode started more than 1 month ago. The problem has been unchanged. The cough is Non-productive. Associated symptoms include nasal congestion. She has tried OTC cough suppressant for the symptoms.    ROS: Negative unless otherwise noted in HPI.  Objective:   Vital Signs:  BP 147/85   Pulse 109   Temp 36.9 C (98.5 F) (Oral)   Resp 20   Ht 162.6 cm (5' 4)   Wt 61.2 kg (135 lb)   LMP  (Approximate)   SpO2 97%   BMI 23.17 kg/m   Body mass  index is 23.17 kg/m. No LMP recorded (approximate). Patient is perimenopausal.  Results for orders placed or performed in visit on 11/26/23  POCT Rapid Strep A  Result Value Ref Range   Rapid Strep A Screen Negative Negative  POCT SARS/Flu/RSV  Result Value Ref Range   POCT SARS-CoV-2 NAA Negative Negative   Influenza A Negative Negative   Influenza B Negative Negative   POC Rapid RSV, NAA Negative Negative   Operator ID Kirk Reagin      Physical Exam Vitals and  nursing note reviewed.  Constitutional:      General: She is not in acute distress.    Appearance: Normal appearance. She is normal weight. She is not ill-appearing or toxic-appearing.  HENT:     Head: Normocephalic.     Right Ear: Tympanic membrane, ear canal and external ear normal.     Left Ear: Tympanic membrane, ear canal and external ear normal.     Nose: Congestion and rhinorrhea present.     Mouth/Throat:     Mouth: Mucous membranes are moist.     Pharynx: Oropharynx is clear.  Eyes:     Extraocular Movements: Extraocular movements intact.     Conjunctiva/sclera: Conjunctivae normal.     Pupils: Pupils are equal, round, and reactive to light.  Cardiovascular:     Rate and Rhythm: Normal rate and regular rhythm.     Heart sounds: Normal heart sounds.  Pulmonary:     Effort: Pulmonary effort is normal. No respiratory distress.     Breath sounds: No stridor. Wheezing present.  Abdominal:     General: Abdomen is flat.     Palpations: Abdomen is soft.  Musculoskeletal:        General: Normal range of motion.     Cervical back: Normal range of motion.  Skin:    General: Skin is warm and dry.     Findings: No rash.  Neurological:     General: No focal deficit present.     Mental Status: She is alert and oriented to person, place, and time. Mental status is at baseline.  Psychiatric:        Mood and Affect: Mood normal.        Behavior: Behavior normal.        Thought Content: Thought content normal.        Judgment: Judgment normal.       PHQ-2 Score: 2  PHQ-9 Score:    Screening complete, no depression identified / no further action needed today   Follow-up as Needed  and Follow-up with PCP  The use of abridge was used to help in the completion of this note. Patient was educated and verbalized consent of its use.   Disposition Upon Discharge:  1.  Routine symptom specific, illness specific and/or disease specific instructions were discussed with the patient  and/or caregiver at length.  2.  The differential diagnosis for the patient's specific symptoms were reviewed and discussed with the patient/caregiver at length; the patient/caregiver expressed understanding of all discussed and their relevant questions were all satisfactorily answered.  3.  Return to care should the presenting symptoms recur, persist, or worsen in any way; or in the alternative, if new symptoms or complaints develop.  4.  The patient and any family present were given verbal and/or written discharge instructions as clinically indicated and appropriate.  5.  Continue OTC medications as needed and tolerated for control of the currently reported symptoms, if no conflict with the currently  prescribed medications.

## 2023-11-27 NOTE — Progress Notes (Signed)
 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 female who presents by e-visit interaction for evaluation.  Assessment/Plan:    Anallely was seen today for cough.  Diagnoses and all orders for this visit:  Acute cough -     codeine-guaiFENesin (GUAIFENESIN AC) 10-100 mg/5 mL liquid; 1 tsp qhs prn cough   We do not normally prescribe cough syrup with codeine but since you were seen yesterday with an exam, I can send a one time small amount in for you. Hopefully the prednisone will also start helping today.I would set up an appt with your PCP for the next couple weeks for followup of this cough. They may want to do a chest xray to make sure all is clear given how long you have been coughing   Follow-up with PCP      Subjective:   HPI Christie Nicholson is 48 y.o. and presents today in the [employer].  The PCP for this patient is Hasanaj, Yancey Skeens, MD.  I have reviewed the E-visit questionnaire submitted by the patient.  Refer to patient E-visit questionnaire and follow-up mychart message and questions for HPI.  I have reviewed the problem list, past medical history, past family history, medications, and allergies.      Objective:   Visit conducted via e-Visit workflow  I spent 5 minutes completing this visit.   As part of this e-Visit, no in-person exam was conducted. This visit was conducted by a [employer].  The patient has attested they are in [place]  during the time of the e-visit.

## 2023-11-28 ENCOUNTER — Other Ambulatory Visit: Payer: Self-pay

## 2023-11-28 ENCOUNTER — Encounter (HOSPITAL_COMMUNITY): Payer: Self-pay

## 2023-11-28 ENCOUNTER — Emergency Department (HOSPITAL_COMMUNITY)

## 2023-11-28 ENCOUNTER — Emergency Department (HOSPITAL_COMMUNITY): Admission: EM | Admit: 2023-11-28 | Discharge: 2023-11-29 | Disposition: A | Discharge status: Home / Self Care

## 2023-11-28 DIAGNOSIS — R0602 Shortness of breath: Secondary | ICD-10-CM | POA: Insufficient documentation

## 2023-11-28 DIAGNOSIS — J40 Bronchitis, not specified as acute or chronic: Secondary | ICD-10-CM | POA: Insufficient documentation

## 2023-11-28 DIAGNOSIS — R0781 Pleurodynia: Secondary | ICD-10-CM | POA: Diagnosis not present

## 2023-11-28 DIAGNOSIS — R079 Chest pain, unspecified: Secondary | ICD-10-CM | POA: Diagnosis present

## 2023-11-28 LAB — D-DIMER, QUANTITATIVE: D-Dimer, Quant: 0.78 ug{FEU}/mL — ABNORMAL HIGH (ref 0.00–0.50)

## 2023-11-28 LAB — BASIC METABOLIC PANEL WITH GFR
Anion gap: 11 (ref 5–15)
BUN: 18 mg/dL (ref 6–20)
CO2: 26 mmol/L (ref 22–32)
Calcium: 8.8 mg/dL — ABNORMAL LOW (ref 8.9–10.3)
Chloride: 104 mmol/L (ref 98–111)
Creatinine, Ser: 0.76 mg/dL (ref 0.44–1.00)
GFR, Estimated: >60 mL/min (ref >60)
Glucose, Bld: 95 mg/dL (ref 70–99)
Potassium: 3.9 mmol/L (ref 3.5–5.1)
Sodium: 141 mmol/L (ref 135–145)

## 2023-11-28 LAB — RESP PANEL BY RT-PCR (RSV, FLU A&B, COVID)  RVPGX2
Influenza A by PCR: NEGATIVE
Influenza B by PCR: NEGATIVE
Resp Syncytial Virus by PCR: NEGATIVE
SARS Coronavirus 2 by RT PCR: NEGATIVE

## 2023-11-28 LAB — CBC
HCT: 41.3 % (ref 36.0–46.0)
Hemoglobin: 13.5 g/dL (ref 12.0–15.0)
MCH: 30.5 pg (ref 26.0–34.0)
MCHC: 32.7 g/dL (ref 30.0–36.0)
MCV: 93.4 fL (ref 80.0–100.0)
Platelets: 291 K/uL (ref 150–400)
RBC: 4.42 MIL/uL (ref 3.87–5.11)
RDW: 13.2 % (ref 11.5–15.5)
WBC: 11.3 K/uL — ABNORMAL HIGH (ref 4.0–10.5)
nRBC: 0 % (ref 0.0–0.2)

## 2023-11-28 MED ORDER — KETOROLAC TROMETHAMINE 15 MG/ML IJ SOLN
15.0000 mg | Freq: Once | INTRAMUSCULAR | Status: AC
  Administered 2023-11-28: 15 mg via INTRAVENOUS
  Filled 2023-11-28: qty 1

## 2023-11-28 MED ORDER — ALBUTEROL SULFATE HFA 108 (90 BASE) MCG/ACT IN AERS
2.0000 | INHALATION_SPRAY | RESPIRATORY_TRACT | Status: DC | PRN
  Administered 2023-11-28: 2 via RESPIRATORY_TRACT
  Filled 2023-11-28: qty 6.7

## 2023-11-28 MED ORDER — IOHEXOL 350 MG/ML SOLN
75.0000 mL | Freq: Once | INTRAVENOUS | Status: AC | PRN
Start: 2023-11-28 — End: 2023-11-28
  Administered 2023-11-28: 75 mL via INTRAVENOUS

## 2023-11-28 NOTE — ED Triage Notes (Signed)
 Went to [facility] urgent care 2 days ago but cough is worse. Had cough for 2 months per patient

## 2023-11-28 NOTE — ED Triage Notes (Signed)
 Pov from home cc of cough for 2 months, went to UC 2 days ago, was diagnosed with bronchitis. and [facility] yesterday. Thinks she broke a rib from coughing.  10/10 left side ribs

## 2023-11-28 NOTE — ED Provider Notes (Signed)
 [facility] AT [facility] Provider Note   CSN: [id number] Arrival date & time: 11/28/23  2153     Patient presents with: Rib Pain from Cough   Christie Nicholson is a 48 y.o. female.   HPI     Presents because of left-sided rib pain.  Has been coughing a lot over the past couple days.  Endorses pleuritic chest pain.  No hemoptysis.  Denies any history of DVT or PE.  Recent orthopedic surgery in June.  No fever no chills but does feel like she is having a productive cough.  Follow-up with urgent care where she was told that she has bronchitis.  Endorses some slight shortness of breath.  No recent travel history.  No unilateral leg swelling.  No exertional chest pain.  Prior to Admission medications   Medication Sig Start Date End Date Taking? Authorizing Provider  albuterol  (VENTOLIN  HFA) 108 (90 Base) MCG/ACT inhaler Inhale 2 puffs into the lungs. 11/26/23  Yes [provider]  amoxicillin (AMOXIL) 500 MG capsule Take 500 mg by mouth. 11/26/23 12/06/23 Yes [provider]  guaiFENesin-codeine 100-10 MG/5ML syrup 1 tsp qhs prn cough 11/27/23  Yes [provider]  predniSONE (DELTASONE) 10 MG tablet Take by mouth. 11/26/23 12/02/23 Yes [provider]  ibuprofen (ADVIL) 200 MG tablet Take 400 mg by mouth every 6 (six) hours as needed for moderate pain (pain score 4-6).    [provider]  oxyCODONE -acetaminophen  (PERCOCET/ROXICET) 5-325 MG tablet Take 1 tablet by mouth every 8 (eight) hours as needed. 03/17/23   Harden Jerona GAILS, MD  oxyCODONE -acetaminophen  (PERCOCET/ROXICET) 5-325 MG tablet Take 1 tablet by mouth every 12 (twelve) hours as needed. 03/19/23   Zamora, Erin R, NP    Allergies: Ciprofloxacin and Phenergan [promethazine hcl]    Review of Systems  Constitutional:  Negative for chills and fever.  HENT:  Negative for ear pain and sore throat.   Eyes:  Negative for pain and visual disturbance.  Respiratory:   Negative for cough and shortness of breath.   Cardiovascular:  Negative for chest pain and palpitations.  Gastrointestinal:  Negative for abdominal pain and vomiting.  Genitourinary:  Negative for dysuria and hematuria.  Musculoskeletal:  Negative for arthralgias and back pain.  Skin:  Negative for color change and rash.  Neurological:  Negative for seizures and syncope.  All other systems reviewed and are negative.   Updated Vital Signs BP 134/79   Pulse 99   Temp 98.1 F (36.7 C)   Resp 18   Ht 5' 4 (1.626 m)   Wt 61.2 kg   SpO2 98%   BMI 23.16 kg/m   Physical Exam Vitals and nursing note reviewed.  Constitutional:      General: She is not in acute distress.    Appearance: She is well-developed.  HENT:     Head: Normocephalic and atraumatic.  Eyes:     Conjunctiva/sclera: Conjunctivae normal.  Cardiovascular:     Rate and Rhythm: Normal rate and regular rhythm.     Heart sounds: No murmur heard. Pulmonary:     Effort: Pulmonary effort is normal. No respiratory distress.     Breath sounds: Normal breath sounds.  Abdominal:     Palpations: Abdomen is soft.     Tenderness: There is no abdominal tenderness.  Musculoskeletal:        General: No swelling.       Arms:     Cervical back: Neck supple.  Skin:    General: Skin is warm and dry.     Capillary Refill: Capillary refill takes less than 2 seconds.  Neurological:     Mental Status: She is alert.  Psychiatric:        Mood and Affect: Mood normal.     (all labs ordered are listed, but only abnormal results are displayed) Labs Reviewed  BASIC METABOLIC PANEL WITH GFR - Abnormal; Notable for the following components:      Result Value   Calcium 8.8 (*)    All other components within normal limits  CBC - Abnormal; Notable for the following components:   WBC 11.3 (*)    All other components within normal limits  D-DIMER, QUANTITATIVE - Abnormal; Notable for the following components:   D-Dimer, Quant 0.78  (*)    All other components within normal limits  RESP PANEL BY RT-PCR (RSV, FLU A&B, COVID)  [id number]    EKG: EKG Interpretation Date/Time:  Friday November 28 2023 22:52:14 EDT Ventricular Rate:  96 PR Interval:  133 QRS Duration:  85 QT Interval:  357 QTC Calculation: 452 R Axis:   79  Text Interpretation: Sinus rhythm Confirmed by Simon Rea [phone]) on 11/28/2023 11:32:05 PM  Radiology: CT Angio Chest PE W and/or Wo Contrast Result Date: 11/28/2023 CLINICAL DATA:  Cough for 2 months. EXAM: CT ANGIOGRAPHY CHEST WITH CONTRAST TECHNIQUE: Multidetector CT imaging of the chest was performed using the standard protocol during bolus administration of intravenous contrast. Multiplanar CT image reconstructions and MIPs were obtained to evaluate the vascular anatomy. RADIATION DOSE REDUCTION: This exam was performed according to the departmental dose-optimization program which includes automated exposure control, adjustment of the mA and/or kV according to patient size and/or use of iterative reconstruction technique. CONTRAST:  75mL OMNIPAQUE  IOHEXOL  350 MG/ML SOLN COMPARISON:  Jul 02, 2021 FINDINGS: Cardiovascular: Satisfactory opacification of the pulmonary arteries to the segmental level. No evidence of pulmonary embolism. Normal heart size. No pericardial effusion. Mediastinum/Nodes: No enlarged mediastinal, hilar, or axillary lymph nodes. Thyroid gland, trachea, and esophagus demonstrate no significant findings. Lungs/Pleura: A stable, likely benign 2 mm pulmonary nodule is seen within the anterolateral aspect of the left upper lobe (axial CT image 60, CT series 6). No acute infiltrate, pleural effusion or pneumothorax is identified. Upper Abdomen: There is a small hiatal hernia. Radiopaque surgical clips are seen within the right upper quadrant. Musculoskeletal: No chest wall abnormality. No acute or significant osseous findings. Review of the MIP images confirms the above findings. IMPRESSION: 1.  No evidence of pulmonary embolism or other acute intrathoracic process. 2. Small hiatal hernia. 3. Evidence of prior cholecystectomy. Electronically Signed   By: Suzen Dials M.D.   On: 11/28/2023 23:59   DG Ribs Unilateral W/Chest Left Result Date: 11/28/2023 EXAM: 1 VIEW(S) XRAY OF THE LEFT RIBS AND CHEST 11/28/2023 10:31:16 PM COMPARISON: None available. CLINICAL HISTORY: left rib pain. Pov from home cc of cough for 2 months, went to UC 2 days ago, was diagnosed with bronchitis. and [facility] yesterday. Thinks she broke a rib from coughing. 10/10 left side ribs FINDINGS: BONES: Question cortical irregularity of anterior left ninth rib, possible nondisplaced fracture. Old healed fracture of posterior left seventh rib. LUNGS AND PLEURA: No consolidation or pulmonary edema. No pleural effusion or pneumothorax. HEART AND MEDIASTINUM: No acute abnormality of the cardiac and mediastinal silhouettes. IMPRESSION: 1. Subtle cortical irregularity of the anterior left ninth rib, question nondisplaced fracture. Electronically signed by: Norman Gatlin MD 11/28/2023 10:37 PM  EDT RP Workstation: [id number]     Procedures   Medications Ordered in the ED  ketorolac  (TORADOL ) 15 MG/ML injection 15 mg (15 mg Intravenous Given 11/28/23 2305)  iohexol  (OMNIPAQUE ) 350 MG/ML injection 75 mL (75 mLs Intravenous Contrast Given 11/28/23 2346)    Clinical Course as of 11/29/23 1835  Sat Nov 29, 2023  0007 CTA is neg for PE, PNA or occult rib fx. Patient has Rx for Abx, steroids and inhaler. Plan discharge with instructions to continue outpatient management of bronchitis. PCP follow up, RTED for any other concerns.   [CS]    Clinical Course User Index [CS] Roselyn Carlin NOVAK, MD                                 Medical Decision Making Amount and/or Complexity of Data Reviewed Labs: ordered. Radiology: ordered.  Risk Prescription drug management.    Presents because of left-sided rib pain.  Has been coughing  a lot over the past couple days.  Endorses pleuritic chest pain.  No hemoptysis.  Denies any history of DVT or PE.  Recent orthopedic surgery in June.  No fever no chills but does feel like she is having a productive cough.  Follow-up with urgent care where she was told that she has bronchitis.  Endorses some slight shortness of breath.  No recent travel history.  No unilateral leg swelling.  No exertional chest pain.   Upon exam, patient medically stable.  No tachycardia or tachypnea.  O2 saturation 98%.  Patient did have recent orthopedic surgery.  Endorsing pleuritic chest pain as well.  Therefore, will obtain D-dimer to workup patient for PE.  Otherwise, no risk factors.  Reproducible pain to this left lower rib side.  Possible subtle fracture seen on the x-ray.  No pneumothorax.  Chest x-ray not indicative of pneumonia at this point time.  Patient will be signed out pending laboratory workup.     Final diagnoses:  Bronchitis  Pleuritic chest pain    ED Discharge Orders     None          Simon Lavonia SAILOR, MD 11/29/23 [phone]

## 2023-11-29 NOTE — ED Provider Notes (Signed)
 Care assumed at shift change. Here for pleuritic chest pain and cough. Recent UC visit for bronchitis. Awaiting CTA to rule out PE given elevated diner.  Physical Exam  BP 134/79   Pulse 99   Temp 98.1 F (36.7 C)   Resp 18   Ht 5' 4 (1.626 m)   Wt 61.2 kg   SpO2 98%   BMI 23.16 kg/m   Physical Exam  Procedures  Procedures  ED Course / MDM   Clinical Course as of 11/29/23 0008  Sat Nov 29, 2023  0007 CTA is neg for PE, PNA or occult rib fx. Patient has Rx for Abx, steroids and inhaler. Plan discharge with instructions to continue outpatient management of bronchitis. PCP follow up, RTED for any other concerns.   [CS]    Clinical Course User Index [CS] Roselyn Carlin NOVAK, MD   Medical Decision Making Problems Addressed: Bronchitis: acute illness or injury Pleuritic chest pain: acute illness or injury  Amount and/or Complexity of Data Reviewed Radiology: independent interpretation performed.  Risk Prescription drug management.          Roselyn Carlin NOVAK, MD 11/29/23 [phone]

## 2024-01-05 ENCOUNTER — Encounter: Payer: Self-pay | Admitting: Radiology

## 2024-01-06 ENCOUNTER — Other Ambulatory Visit: Payer: Self-pay | Admitting: Medical Genetics

## 2024-01-06 DIAGNOSIS — Z006 Encounter for examination for normal comparison and control in clinical research program: Secondary | ICD-10-CM

## 2024-02-04 LAB — GENECONNECT MOLECULAR SCREEN: Genetic Analysis Overall Interpretation: NEGATIVE

## 2024-03-03 NOTE — Discharge Summary (Signed)
 ------------------------------------------------------------------------------- Attestation signed by Feliz Margart Fallow, DO at 03/05/24 1805 I was the supervising physician during the time of service.  Case and lab work discussed with APP as well as formulation of treatment plan.  I agree with management as outlined below. -------------------------------------------------------------------------------   DISCHARGE SUMMARY [facility]   Discharge date:   March 03, 2024 Length of stay:    LOS: 1 day    Discharge Service:   [facility] Hospitalists Discharge Attending Physician: Joana Marice Hurst, PA Discharge to:    To Home Condition at Discharge:  stable Code status:                         Full Code   Hospital Course: Christie Nicholson is a 48 year old female with a history of undiagnosed COPD, asthma, arthritis, fibroids, and a 33-year smoking history who was admitted on 03/02/2024 for acute respiratory failure with hypoxemia in the setting of Influenza A and failure of outpatient management for progressive dyspnea on exertion.  PROBLEM-ORIENTED HOSPITAL SUMMARY  **Acute Respiratory Failure with Hypoxemia / Hypoxemia / Dyspnea on Exertion** The patient presented with several days of worsening shortness of breath and dyspnea on exertion, unable to walk more than a few feet without severe shortness of breath. On admission, she was in acute respiratory distress, unable to speak in full sentences, and required 2 L supplemental oxygen despite no prior home oxygen use. ABG revealed a PaO2 of 57 mmHg and O2 saturation of 90.3%. Walk test in the ED showed desaturation to 88% with minimal exertion. Physical exam was notable for diffuse wheezing in all lung fields. She was treated with IV ceftriaxone , oral azithromycin, nebulized bronchodilators, and systemic corticosteroids. Oxygen requirement and respiratory status were closely monitored during hospitalization.  **Influenza  A** The patient tested positive for Influenza A on 02/28/2024 and was admitted after failing to improve with outpatient management. She was started on oseltamivir during hospitalization. Chest imaging showed mild diffuse bronchial wall thickening suggestive of acute bronchitis/bronchiolitis, without consolidation or effusion.  **Failure of Outpatient Treatment** She was evaluated in the ED on 02/27/2024 and 02/28/2024 for similar symptoms and discharged home both times, but returned with persistent and worsening dyspnea on exertion, prompting admission.  **COPD (Chronic Obstructive Pulmonary Disease) / Asthma** History is notable for undiagnosed COPD and asthma, with a significant smoking history. She was not on home oxygen prior to admission. Her respiratory symptoms and need for bronchodilator therapy were managed during this admission.  **Significant Events** On 03/02/2024 at approximately 6:30 PM, the patient expressed a desire to leave AMA due to personal obligations but was counseled regarding the risks of leaving with ongoing oxygen needs and ultimately agreed to remain hospitalized. She reported feeling jittery with DuoNeb, and her nebulizer regimen was changed to Atrovent with Xopenex.  **Functional Status** Physical and occupational therapy evaluations on 03/03/2024 found her independent with bed mobility, transfers, and ambulation within her hospital room, and she declined further therapy services.  **Advance Care Planning** A discussion was held on 03/02/2024, and the patient elected to remain full code.   ______________________________________  Admission HPI    Patient admitted on: 03/02/2024 11:31 AM  Patient admitted by: Margart Fallow Feliz, DO    CHIEF COMPLAINT: Shortness of breath, dyspnea on exertion   Day of admission HPI:  Christie Nicholson  is a 48 y.o. female with a PMH significant for arthritis, fibroids, undiagnosed COPD, 33-year smoking history who presented  with shortness of breath  with dyspnea on exertion since December 26   Patient admitted on Home O2? - no Patient on home anticoagulant? -  no Patient admitted with Chronic home foley catheter? - no Foley catheter placed or replaced by another service prior to admission? - no Central Line Status: NONE   Mental Status on Admission: The patient is Alert and oriented to PERSON The patient is Alert And oriented to TIME The patient is Alert and oriented to LOCATION   Problem List, Assessment & Plan     ASSESSMENT & PLAN (In order of descending acuity)   Principal Problem:   Acute respiratory failure with hypoxemia    (CMS-HCC) Active Problems:   Influenza A   Dyspnea on exertion   Hypoxemia   Failure of outpatient treatment     Acute respiratory failure with hypoxemia    (CMS-HCC)  // Dyspnea on exertion Admit to hospitalist services observation to the MedSurg unit with probability of less than 48-hour stay Initiate IV Rocephin  and p.o. Zithromax DuoNeb every 6 hours x 2 days Solu-Medrol 40 mg every 6 hours x 3 days Incentive spirometry and Acapella Supplemental oxygen     Influenza A Tamiflu     Hypoxemia ABG showed PaO2 of 57 Walk test in ED and patient decompensated to 88% SpO2. Requiring 2 L currently; never use oxygen at home     Failure of outpatient treatment Was seen in the ER on December 26 and sent home Did not get any better and returned on December 27 and was sent home again Returned today stating that she is dyspneic on exertion. Walks a short distance from her bedroom to the bathroom and is severely short of breath   Incidental Findings for ourpatient Follow-Up: No significant incidental findings present    ADDITIONAL NON-ACUTE FINDINGS, OBSERVATIONS, FAMILY DISCUSSIONS, ETC. (When present):   Physical exam General: acute Respiratory distress => on morning rounds today, patient is breathing much better.  She has been on 2 L through the night and into the  morning.  We were able to wean her down to room air and she tolerated it well.  Her O2 sats were 90% which is acceptable. HEENT [place]/AT; moist mucous membrane Heart: Heart rate wnl: Rhythm is reg    Lungs: Wheezing in all lung fields Abdomen: Soft nontender nondistended Extremities: No peripheral edema Skin: Dry no rash Neuropsych: Within normal limits memory: Awake alert and oriented x 3   DVT Prophylaxis Ordered: None, anticipate short stay  ______   March 03, 2024 => Patient came in with shortness of breath and dyspnea on exertion and ABG showed PaO2 of 57.  History of smoking but never been on oxygen. Yesterday she wanted to leave AMA.  We explained to her that she was on oxygen and that would not be safe to leave AMA due to her oxygen needs. We will try to wean her off the oxygen today and plan to discharge her if she is clinically stable.  => She was put on room air and did well. Patient is clinically stable to be discharged home.  I spoke with the patient and her father and they are agreeable. ______________________________________  Mental Status On day of Discharge:  The patient is Alert and oriented to PERSON The patient is Alert And oriented to TIME The patient is Alert and oriented to LOCATION  CODE STATUS :                    Full Code  An advanced care planning discussion was  had with patient and/or patient's decisions maker (documented separately).  Patient discharged on Home O2? - no Patient discharged on home anticoagulant? -  no  Foley Catheter status: None Central Line Status: NONE  Time Spent on Discharge I spent greater than 30 minutes counseling and coordinating care for the discharge of this patient. The patient and I discussed the importance of outpatient follow-up as well as concerning signs and symptoms that would require immediate evaluation by a medical professional. The aforementioned conversation participants understand  and did show insight. I  did use teachback to ensure understanding. The above participant/s is aware that not following the discussed plan, recommendations, and follow up can lead to severe negative effects on the patient's health, up to and including death.  Discharge Medications     Your Medication List     START taking these medications    AEROCHAMBER MV inhaler Generic drug: inhalational spacing device 1 each by Miscellaneous route every four (4) hours as needed (use with your albuterol  inhaler; ask pharmacist how to use).   azithromycin 500 MG tablet Commonly known as: ZITHROMAX Take 1 tablet (500 mg total) by mouth daily. Start taking on: March 04, 2024   cefdinir 300 MG capsule Commonly known as: OMNICEF Take 1 capsule (300 mg total) by mouth every twelve (12) hours for 10 days.   oseltamivir 75 MG capsule Commonly known as: TAMIFLU Take 1 capsule (75 mg total) by mouth two (2) times a day for 5 days. Start taking on: March 04, 2024       CHANGE how you take these medications    albuterol  90 mcg/actuation inhaler Commonly known as: PROVENTIL  HFA;VENTOLIN  HFA Inhale 2 puffs every six (6) hours as needed for wheezing. What changed:  when to take this reasons to take this       CONTINUE taking these medications    BREO ELLIPTA 100-25 mcg/dose inhaler Generic drug: fluticasone furoate-vilanterol Inhale 1 puff.   dexAMETHasone  2 MG tablet Commonly known as: DECADRON  Take 3 tablets (6 mg total) by mouth daily for 5 days.   HYDROcodone-chlorpheniramine polistirex 10-8 mg/5 mL ER suspension Commonly known as: TUSSIONEX PENNKINETIC Take 5 mL by mouth every twelve (12) hours as needed for cough.   sertraline 50 MG tablet Commonly known as: ZOLOFT Take 1 tablet (50 mg total) by mouth daily.       _____________________________________  Nutrition:                                        ___________________________________________  Discharge Instructions   You are admitted  to the hospital because you had the flu and COPD exacerbation.  He also needed oxygen. By the next day and with treatment, you had improved.  By early afternoon, you did not need any more oxygen and you saturating at 90% on room air.  You will need to take the following medications to continue to get better. I strongly encourage you not to be active. For the next few days, I would encourage you to stay in bed and do minimal activity until you fully improved.  Resume your normal home meds. This includes your Breo Ellipta and albuterol .  Here are the medications that I am giving you:  AEROCHAMBER MV inhaler Generic drug: inhalational spacing device 1 each by Miscellaneous route every four (4) hours as needed (use with your  albuterol  inhaler; ask pharmacist how to use).   azithromycin 500 MG tablet Commonly known as: ZITHROMAX Take 1 tablet (500 mg total) by mouth daily. Start taking on: March 04, 2024   cefdinir 300 MG capsule Commonly known as: OMNICEF Take 1 capsule (300 mg total) by mouth every twelve (12) hours for 10 days.   oseltamivir 75 MG capsule Commonly known as: TAMIFLU Take 1 capsule (75 mg total) by mouth two (2) times a day for 5 days. Start taking on: March 04, 2024    Have been called into [place]. Use the AeroChamber with your albuterol . Take the Zithromax once a day for 5 days Use the cefdinir 300 mg.  1 tablet twice a day for 10 days. Use the Tamiflu 75 mg twice a day for 5 days. All of these medications can be started tomorrow, March 04, 2024.  I strongly encourage you to make an appointment to see your family doctor on Monday or Tuesday of the following week for continuity of care (January 5 or January 6).  Let them know you are hospitalized. I encourage you to wear mask at home so nobody else gets the flu from you and you do not catch any respiratory problems while you are trying to get better from the current problem.  It has been a pleasure taking  care of you. We hope you get well soon. If there is anything you need, please do not hesitate to return to our ER or go to the closest ER.  Mr LOISE. Bretta RIGGERS [facility] Hospitalist [facility] [place]  Nutrition:                                   Activity:                                   Activity Instructions     Activity as tolerated         Appointments:                         Appointments which have been scheduled for you    Mar 10, 2024 3:00 PM MAMMO SCREENING BILATERAL with PIERCE MAMMO RM 1 IMG MAMMO [facility]) [address] [place] [address] [phone]  Please wear a two piece outfit,  Do not wear deodorant, powder, oils or lotion on your chest and underarms.        Follow Up:                              Follow Up instructions and Outpatient Referrals    Call MD for:  difficulty breathing, headache or visual disturbances     Call MD for:  persistent dizziness or light-headedness     Call MD for:  persistent nausea or vomiting     Call MD for:  severe uncontrolled pain     Call MD for: Temperature > 38.5 Celsius ( > 101.3 Fahrenheit)     Discharge instructions         Allergies  Allergen Reactions   Ciprofloxacin Hives and Rash    Rash and hives   Phenergan Dm Hives   Promethazine Hcl Hives and Rash    Rash and hives  Past Medical History[1]  Past Surgical History[2]   Family History[3]   Current Medications[4]  Imaging  ECG 12 Lead Result Date: 03/02/2024 Normal sinus rhythm with sinus arrhythmia Normal ECG No previous ECGs available Confirmed by Cherie Searle ([id number]) on 03/02/2024 5:16:12 PM  ECG 12 Lead Result Date: 03/02/2024 Normal sinus rhythm Normal ECG When compared with ECG of 02-Mar-2024 13:25, No significant change was found Confirmed by Cherie Searle ([id number]) on 03/02/2024 5:15:40 PM  XR Chest Portable Result Date: 03/02/2024 Exam:  Portable Chest  History:  Shortness of breath  and cough. Influenza A positive, 02/28/2024.  Technique:  Single AP portable upright view of the chest.  Comparison:  Chest x-ray, 02/28/2024.  Findings: The cardiopericardial silhouette is normal in size. Mild diffuse bronchial wall thickening and/or mild pulmonary vascular congestion. No large confluent focal or lung consolidation. No large pleural effusion. No pneumothorax. No definite acute abnormality of the demonstrated thoracic bones.    Mild diffuse bronchial wall thickening suggestive of acute bronchitis/bronchiolitis. Differential considerations include mild pulmonary vascular congestion. Recommend clinical correlation and follow-up chest imaging to resolution.  Signed (Electronic Signature): 03/02/2024 11:51 AM Signed By: Donnice Cerise, MD   Lab Results   Recent Labs    03/03/24 0518  WBC 2.8*  HGB 11.5  HCT 35.9  PLT 194   Recent Labs    03/02/24 1159 03/03/24 0518  NA 142 144  K 3.9 4.0  CL 105 107  CO2 25.7 28.9  BUN 23* 20  CREATININE 0.82 0.76  GLU 121 143  CALCIUM 8.8 8.8  ALBUMIN 3.3*  --   PROT 8.2*  --   BILITOT 0.5  --   AST 37  --   ALT 64  --   ALKPHOS 85  --   MG 2.2  --    Recent Labs    03/02/24 1159 03/02/24 1428 03/02/24 1723  TROPONINI 7   < > 5  DDIMER 367  --   --    < > = values in this interval not displayed.   Recent Labs    03/02/24 1345  WBCUA 0  NITRITE Negative  LEUKOCYTESUR Negative  BACTERIA None Seen  RBCUA 0  BLOODU Negative  GLUCOSEU Negative  PROTEINUA Trace*  KETONESU Negative   No results for input(s): OPIAU, BENZU, TRICYCLIC, PCPU, AMPHU, COCAU, CANNAU, BARBU, ETOH, ACETAMIN, SALICYLATE in the last 72 hours. No results for input(s): PREGTESTUR, PREGPOC in the last 72 hours. No results for input(s): OCCULTBLD, RAPSCRN, CDIFRPCR, CDIFFNAP1, A1C, CHOL, LDL, HDL, TRIG in the last 72 hours. Recent Labs    03/02/24 1200  PHART 7.36  PCO2ART 46.5  PO2ART 57*  HCO3ART  26.1*  O2SATART 90.3*  BEART 0.1     Home Medications   Prior to Admission medications  Medication Dose, Route, Frequency  albuterol  HFA 90 mcg/actuation inhaler 2 puffs, Inhalation, Every 4 hours PRN  BREO ELLIPTA 100-25 mcg/dose inhaler 1 puff  dexAMETHasone  (DECADRON ) 2 MG tablet 6 mg, Oral, Daily (standard)  HYDROcodone-chlorpheniramine polistirex (TUSSIONEX PENNKINETIC) 10-8 mg/5 mL ER suspension 5 mL, Oral, Every 12 hours PRN  sertraline (ZOLOFT) 50 MG tablet 1 tablet, Daily (standard)  albuterol  HFA 90 mcg/actuation inhaler 2 puffs, Inhalation, Every 6 hours PRN  azithromycin (ZITHROMAX) 500 MG tablet 500 mg, Oral, Every 24 hours  cefdinir (OMNICEF) 300 MG capsule 300 mg, Oral, Every 12 hours  inhalational spacing device (AEROCHAMBER MV) Spcr 1 each, Miscellaneous, Every 4 hours PRN  oseltamivir (TAMIFLU) 75 MG capsule 75 mg,  Oral, 2 times a day (standard)   Joana JONETTA Hurst, PA Hospitalist, [facility] 03/03/2024, 1:51 PM      [1] Past Medical History: Diagnosis Date   Asthma (HHS-HCC)    Fractures    right ankle-hardware  [2] Past Surgical History: Procedure Laterality Date   ANKLE FRACTURE SURGERY  2020   APPENDECTOMY     CARPAL TUNNEL RELEASE Right    CHOLECYSTECTOMY     HAND SURGERY  2019   Carpal tunnel   PR COLONOSCOPY W/BIOPSY SINGLE/MULTIPLE  02/13/2024   Procedure: COLONOSCOPY, FLEXIBLE, PROXIMAL TO SPLENIC FLEXURE; WITH BIOPSY, SINGLE OR MULTIPLE;  Surgeon: Celia Duwaine Sauer, MD;  Location: [facility];  Service: General Surgery   TUBAL LIGATION    [3] Family History Problem Relation Age of Onset   Bladder cancer Father    Cancer Paternal Grandfather    Throat cancer Paternal Grandfather    Breast cancer Paternal Grandmother    Cancer Paternal Grandmother    Ovarian cancer Paternal Aunt   [4]  Current Facility-Administered Medications:    acetaminophen  (TYLENOL ) suppository 650 mg, 650 mg, Rectal, Q6H PRN, Panwala, Naitik  Dhansukh, PA   acetaminophen  (TYLENOL ) tablet 650 mg, 650 mg, Oral, Q6H PRN, Panwala, Naitik Dhansukh, PA   azithromycin (ZITHROMAX) tablet 500 mg, 500 mg, Oral, Q24H SCH, Panwala, Naitik Dhansukh, PA, 500 mg at 03/03/24 0930   cefTRIAXone  (ROCEPHIN ) IVPB 1 g in 50 mL dextrose  (premix), 1 g, Intravenous, Q24H SCH, Panwala, Naitik Dhansukh, PA, Stopped at 03/03/24 1029   guaiFENesin (ROBITUSSIN) oral syrup, 200 mg, Oral, Q4H PRN, Panwala, Naitik Dhansukh, PA   ipratropium (ATROVENT) 0.02 % nebulizer solution 500 mcg, 500 mcg, Nebulization, Q6H (RT), Panwala, Naitik Dhansukh, PA, 500 mcg at 03/03/24 0951   levalbuterol (XOPENEX) nebulizer solution 1.25 mg, 1.25 mg, Nebulization, Q6H (RT), Panwala, Naitik Dhansukh, PA, 1.25 mg at 03/03/24 0950   melatonin tablet 3 mg, 3 mg, Oral, Nightly PRN, Panwala, Naitik Dhansukh, PA   methylPREDNISolone sodium succinate (SOLU-Medrol) injection 40 mg, 40 mg, Intravenous, Q6H, Panwala, Naitik Dhansukh, PA, 40 mg at 03/03/24 1349   nicotine (NICODERM CQ) 14 mg/24 hr patch 1 patch, 1 patch, Transdermal, Daily, Panwala, Naitik Dhansukh, PA, 1 patch at 03/02/24 1909   ondansetron  (ZOFRAN ) injection 4 mg, 4 mg, Intravenous, Q6H PRN, Panwala, Naitik Dhansukh, PA   oseltamivir (TAMIFLU) capsule 75 mg, 75 mg, Oral, BID, Panwala, Naitik Dhansukh, PA, 75 mg at 03/03/24 0950   phenol (CHLORASEPTIC) 1.4 % spray 2 spray, 2 spray, Mucous Membrane, Q2H PRN, Panwala, Naitik Dhansukh, PA   sertraline (ZOLOFT) tablet 50 mg, 50 mg, Oral, Daily, Panwala, Naitik Dhansukh, PA, 50 mg at 03/03/24 [phone]

## 2024-03-05 NOTE — ED Provider Notes (Signed)
 "                                                                                   Emergency Department Provider Note    ED Clinical Impression   Final diagnoses:  Influenza (Primary)  Pneumonia due to influenza A virus  Reactive airway disease without complication, unspecified asthma severity, unspecified whether persistent (HHS-HCC)    ED Assessment/Plan    Condition: Stable Disposition: Discharge  This chart has been completed using Engineer, Civil (consulting) software, and while attempts have been made to ensure accuracy, certain words and phrases may not be transcribed as intended.   History   Chief Complaint  Patient presents with   Shortness of Breath   HPI  Christie Nicholson is a 49 y.o. female  who presents today to the  emergency department complaining of cough, congestion, body aches.  Patient was diagnosed with influenza on the 30th and was admitted for influenza and pneumonia.  Patient said that she still has some trouble breathing.  She complains of cough and congestion.  She states is having difficulty catching her breath.  She was discharged on Zithromax and cefdinir, as well as Tamiflu.   Allergies: is allergic to ciprofloxacin, phenergan dm, and promethazine hcl. Medications: has a current medication list which includes the following long-term medication(s): albuterol , albuterol , breo ellipta, and sertraline. PMHx:  has a past medical history of Asthma (HHS-HCC) and Fractures. PSHx:  has a past surgical history that includes Carpal tunnel release (Right); Tubal ligation; Appendectomy; Cholecystectomy; Hand surgery (2019); Ankle fracture surgery (2020); and pr colonoscopy w/biopsy single/multiple (02/13/2024). SocHx:  reports that she has been smoking cigarettes. She has been exposed to tobacco smoke. She has never used smokeless tobacco. She reports that she does not currently use alcohol. She reports that she does not currently use drugs after having used the  following drugs: Marijuana. Allergies, Medications, Medical, Surgical, and Social History were reviewed as documented above.   Social Drivers of Health with Concerns   Food Insecurity: No Food Insecurity (03/05/2024)   Hunger Vital Sign    Worried About Running Out of Food in the Last Year: Never true    Ran Out of Food in the Last Year: Never true  Recent Concern: Food Insecurity - Food Insecurity Present (01/09/2024)   Hunger Vital Sign    Worried About Running Out of Food in the Last Year: Often true    Ran Out of Food in the Last Year: Often true  Tobacco Use: High Risk (03/05/2024)   Patient History    Smoking Tobacco Use: Every Day    Smokeless Tobacco Use: Never    Passive Exposure: Current  Social Connections: Socially Isolated (03/05/2024)   Social Connection and Isolation Panel    Frequency of Communication with Friends and Family: More than three times a week    Frequency of Social Gatherings with Friends and Family: More than three times a week    Attends Religious Services: Never    Database Administrator or Organizations: No    Attends Banker Meetings: Never    Marital Status: Divorced     Review Of Systems  Review of Systems  Constitutional:  Positive for chills. Negative for fever.  HENT:  Positive for congestion.   Respiratory:  Positive for cough and shortness of breath. Negative for chest tightness.   Cardiovascular:  Negative for chest pain.  Gastrointestinal:  Negative for abdominal pain.  Musculoskeletal:  Positive for myalgias.  Skin:  Negative for color change.  Psychiatric/Behavioral:  Negative for behavioral problems.   All other systems reviewed and are negative.   Physical Exam   BP 115/84   Pulse 63   Temp 37.2 C (99 F) (Oral)   Resp 19   Ht 162.6 cm (5' 4)   Wt 74.4 kg (164 lb)   LMP 01/17/2024 (Approximate)   SpO2 93%   BMI 28.15 kg/m   Physical Exam Vitals and nursing note reviewed.  Constitutional:       General: She is not in acute distress. HENT:     Head: Normocephalic.  Eyes:     Conjunctiva/sclera: Conjunctivae normal.  Cardiovascular:     Rate and Rhythm: Regular rhythm.     Pulses: Normal pulses.     Heart sounds: Normal heart sounds.  Pulmonary:     Effort: No respiratory distress.     Breath sounds: Wheezing present.     Comments: Patient has faint occasional wheezes. Abdominal:     General: There is no distension.     Tenderness: There is no abdominal tenderness. There is no guarding.  Musculoskeletal:        General: No deformity.  Skin:    General: Skin is warm.     Capillary Refill: Capillary refill takes 2 to 3 seconds.     Comments: Normal cap refill.  Neurological:     General: No focal deficit present.     Mental Status: She is oriented to person, place, and time.     Cranial Nerves: No cranial nerve deficit.     Sensory: No sensory deficit.  Psychiatric:        Mood and Affect: Mood normal.     ED Course  Medical Decision Making Differential diagnosis includes symptoms of influenza versus worsening pneumonia.  Patient does have some wheezing.  Will give breathing treatment.  4:19 PM Patient is doing better.  She still states that she has difficulty breathing but states at 95.  I do not hear any wheezing at this point.  Chest x-ray is unremarkable.  Potassium has been replaced.  Will go ahead and get a CT angio.  4:57 PM CT head reviewed.  No pulmonary embolus.  Patient is doing well.  He feels better.  Sats are in the 9697 range.  Have counseled her to continue taking her antibiotics and the medication.  She has no oxygen requirement.  There is no indication for admission.  She decision-making with patient.  She is stable for discharge.  I have reviewed my clinical findings and studies and my clinical impression with the patient. The patient has expressed understanding that at this time there is no evidence for a more malignant underlying process, but the  patient also understands that early in the process of a condition such as this, an initial workup can be falsely reassuring. I have counseled the patient and discussed follow-up with the patient, stressing the importance of appropriate follow-up. I have also counseled the patient to return if worse or any concerns. Routine discharge counseling was given to the patient and the patient understands that worsening, changing or persistent symptoms should prompt an immediate call  or follow up with their primary physician or return to the emergency department for reevaluation. Patient has expressed understanding.     Problems Addressed: Influenza: acute illness or injury that poses a threat to life or bodily functions Pneumonia due to influenza A virus: acute illness or injury that poses a threat to life or bodily functions Reactive airway disease without complication, unspecified asthma severity, unspecified whether persistent (HHS-HCC): acute illness or injury that poses a threat to life or bodily functions  Amount and/or Complexity of Data Reviewed Labs: ordered. Decision-making details documented in ED Course. Radiology: ordered. Decision-making details documented in ED Course.  Risk Prescription drug management.     Procedures   No results found for this visit on 03/05/24 (from the past 4464 hours).   ED Results Results for orders placed or performed during the hospital encounter of 03/05/24  Comprehensive Metabolic Panel  Result Value Ref Range   Sodium 146 (H) 135 - 145 mmol/L   Potassium 2.6 (LL) 3.5 - 5.0 mmol/L   Chloride 108 (H) 98 - 107 mmol/L   CO2 27.5 21.0 - 32.0 mmol/L   Anion Gap 11 3 - 11 mmol/L   BUN 17 8 - 20 mg/dL   Creatinine 9.10 9.39 - 1.10 mg/dL   BUN/Creatinine Ratio 19    eGFR CKD-EPI (2021) Female 80 >=60 mL/min/1.74m2   Glucose 118 70 - 179 mg/dL   Calcium 8.3 (L) 8.5 - 10.1 mg/dL   Albumin 2.8 (L) 3.5 - 5.0 g/dL   Total Protein 7.0 6.0 - 8.0 g/dL    Total Bilirubin 0.4 0.3 - 1.2 mg/dL   AST 36 15 - 40 U/L   ALT 140 (H) 12 - 78 U/L   Alkaline Phosphatase 75 46 - 116 U/L  Magnesium  Result Value Ref Range   Magnesium 2.0 1.6 - 2.6 mg/dL  hCG QUANTitative, Blood  Result Value Ref Range   hCG Quantitative 3.0 mIU/mL  CBC w/ Differential  Result Value Ref Range   WBC 7.6 4.0 - 10.5 10*9/L   RBC 3.95 3.80 - 5.10 10*12/L   HGB 11.8 11.5 - 15.0 g/dL   HCT 63.4 65.9 - 55.9 %   MCV 92.4 80.0 - 98.0 fL   MCH 29.9 27.0 - 34.0 pg   MCHC 32.3 32.0 - 36.0 g/dL   RDW 87.5 88.4 - 85.4 %   MPV 11.1 (H) 7.4 - 10.4 fL   Platelet 255 140 - 415 10*9/L   Neutrophils % 59.3 %   Lymphocytes % 30.9 %   Monocytes % 6.4 %   Eosinophils % 0.7 %   Basophils % 0.3 %   Absolute Neutrophils 4.5 1.8 - 7.8 10*9/L   Absolute Lymphocytes 2.4 0.7 - 4.5 10*9/L   Absolute Monocytes 0.5 0.1 - 1.0 10*9/L   Absolute Eosinophils 0.1 0.0 - 0.4 10*9/L   Absolute Basophils 0.0 0.0 - 0.2 10*9/L   CTA Chest W Contrast Result Date: 03/05/2024 Exam:  CT Angiogram Chest (Pulmonary Embolism Protocol)  History:  Dyspnea  Technique: Chest CTA with IV contrast (pulmonary embolism protocol). This examination was specifically tailored to evaluate the pulmonary arteries for the presence of intraluminal thrombus.  Volumetric axial CT Maximum Intensity Projection (MIP) pulmonary angiographic images were generated at the scanner and sent to PACS for review. AEC (automated exposure control) and/or manual techniques such as size-specific kV and mAs are employed where appropriate to reduce radiation exposure for all CT exams.  Comparison:  None  Chest CT Findings:  PULMONARY ARTERIES: No acute pulmonary embolism. Pulmonary arteries normal caliber.  LUNGS: Patent central airways. No bronchial wall thickening or bronchiectasis. Patchy bilateral multilobar groundglass opacities. Mild bibasal volume loss.  PLEURA: No pleural effusion or pneumothorax.  HEART: Normal heart size. No pericardial  effusion. Multivessel coronary artery calcifications.  AORTA: No acute pathology. Normal caliber. No significant atherosclerosis.  MEDIASTINUM: No lymphadenopathy.  BONES: No aggressive lesion. No acute abnormality.  SOFT TISSUES: Normal.  UPPER ABDOMEN: Cholecystectomy. No acute abnormality.    1.    Negative for acute pulmonary embolism. 2.    Patchy bilateral multilobar groundglass opacities, suggestive of atypical infection. 3.    Multivessel coronary artery calcifications.  Signed (Electronic Signature): 03/05/2024 4:37 PM Signed By: Alm Platt, MD  XR Chest Portable Result Date: 03/05/2024 Exam:  Portable Chest  History:  Dyspnea  Technique:  Single frontal view.  Comparison:  Radiographs dated 03/02/2024  Findings:   The heart size, mediastinal contours, pleural surfaces and pulmonary vascularity are all normal. Generalized interstitial prominence. No focal airspace opacities. There is no detectable pleural effusion or pneumothorax. The regional bones are normal for age.    Generalized interstitial prominence which can be seen in setting of small airways disease or viral infection.  Signed (Electronic Signature): 03/05/2024 2:16 PM Signed By: Anselm Scripture, MD   Medications Administered:  Medications  ipratropium-albuterol  (DUO-NEB) 0.5-2.5 mg/3 mL nebulizer solution 3 mL (3 mL Nebulization Given 03/05/24 1350)  methylPREDNISolone sodium succinate (SOLU-Medrol) injection 125 mg (125 mg Intravenous Given 03/05/24 1350)  potassium chloride ER tablet 40 mEq (40 mEq Oral Given 03/05/24 1514)  iohexol  (OMNIPAQUE ) 350 mg iodine/mL solution 75 mL (75 mL Intravenous Given 03/05/24 1631)    Discharge Medications (Medications Prescribed during this  ED visit and Patient's Home Medications) :    Your Medication List     START taking these medications    predniSONE  20 MG tablet Commonly known as: DELTASONE  Take 3 tablets (60 mg total) by mouth daily for 5 days.       CHANGE how you take these  medications    albuterol  90 mcg/actuation inhaler Commonly known as: PROVENTIL  HFA;VENTOLIN  HFA Inhale 2 puffs every six (6) hours as needed for wheezing. What changed: Another medication with the same name was added. Make sure you understand how and when to take each.   albuterol  90 mcg/actuation inhaler Commonly known as: PROVENTIL  HFA;VENTOLIN  HFA Inhale 2 puffs every four (4) hours as needed for wheezing. What changed: You were already taking a medication with the same name, and this prescription was added. Make sure you understand how and when to take each.       ASK your doctor about these medications    AEROCHAMBER MV inhaler Generic drug: inhalational spacing device 1 each by Miscellaneous route every four (4) hours as needed (use with your albuterol  inhaler; ask pharmacist how to use).   azithromycin 500 MG tablet Commonly known as: ZITHROMAX Take 1 tablet (500 mg total) by mouth daily.   BREO ELLIPTA 100-25 mcg/dose inhaler Generic drug: fluticasone furoate-vilanterol Inhale 1 puff.   cefdinir 300 MG capsule Commonly known as: OMNICEF Take 1 capsule (300 mg total) by mouth every twelve (12) hours for 10 days.   dexAMETHasone  2 MG tablet Commonly known as: DECADRON  Take 3 tablets (6 mg total) by mouth daily for 5 days. Ask about: Should I take this medication?   HYDROcodone-chlorpheniramine polistirex 10-8 mg/5 mL ER suspension Commonly known as: TUSSIONEX PENNKINETIC Take 5 mL by mouth  every twelve (12) hours as needed for cough.   oseltamivir 75 MG capsule Commonly known as: TAMIFLU Take 1 capsule (75 mg total) by mouth two (2) times a day for 5 days.   sertraline 50 MG tablet Commonly known as: ZOLOFT Take 1 tablet (50 mg total) by mouth daily.          Cherie Ardeen Hanger, MD 03/05/24 1658  "

## 2024-03-09 ENCOUNTER — Encounter (HOSPITAL_COMMUNITY): Payer: Self-pay

## 2024-03-09 ENCOUNTER — Other Ambulatory Visit: Payer: Self-pay

## 2024-03-09 ENCOUNTER — Emergency Department (HOSPITAL_COMMUNITY)
Admission: EM | Admit: 2024-03-09 | Discharge: 2024-03-10 | Disposition: A | Discharge status: Home / Self Care | Attending: Emergency Medicine | Admitting: Emergency Medicine

## 2024-03-09 ENCOUNTER — Emergency Department (HOSPITAL_COMMUNITY)

## 2024-03-09 DIAGNOSIS — R0602 Shortness of breath: Secondary | ICD-10-CM | POA: Diagnosis present

## 2024-03-09 DIAGNOSIS — J441 Chronic obstructive pulmonary disease with (acute) exacerbation: Secondary | ICD-10-CM | POA: Diagnosis not present

## 2024-03-09 MED ORDER — IPRATROPIUM-ALBUTEROL 0.5-2.5 (3) MG/3ML IN SOLN
3.0000 mL | Freq: Once | RESPIRATORY_TRACT | Status: AC
  Administered 2024-03-10: 3 mL via RESPIRATORY_TRACT
  Filled 2024-03-09: qty 3

## 2024-03-09 NOTE — ED Provider Notes (Signed)
 "  [facility] AT [facility]  Provider Note  CSN: [id number] Arrival date & time: 03/09/24 1949  History Chief Complaint  Patient presents with   Pneumonia    Christie Nicholson is a 49 y.o. female with history of COPD has been sick for the last couple of weeks, seen at [facility] x 4 for same, diagnosed with influenza, had one admission for COPD on 12/30, last seen 1/2 where CTA was done showing possible atypical pneumonia. Christie Nicholson has been on multiple different medications including Antivirals, antibiotics and steroids. Christie Nicholson has been using inhaler and cough syrup at home with minimal improvement. No fevers recently.    Home Medications Prior to Admission medications  Medication Sig Start Date End Date Taking? Authorizing Provider  predniSONE  (STERAPRED UNI-PAK 21 TAB) 10 MG (21) TBPK tablet 10mg  Tabs, 6 day taper. Use as directed 03/10/24  Yes Roselyn Carlin NOVAK, MD  albuterol  (VENTOLIN  HFA) [phone] Base) MCG/ACT inhaler Inhale 2 puffs into the lungs. 11/26/23   [provider]  guaiFENesin-codeine 100-10 MG/5ML syrup 1 tsp qhs prn cough 11/27/23   [provider]  ibuprofen (ADVIL) 200 MG tablet Take 400 mg by mouth every 6 (six) hours as needed for moderate pain (pain score 4-6).    [provider]  oxyCODONE -acetaminophen  (PERCOCET/ROXICET) 5-325 MG tablet Take 1 tablet by mouth every 8 (eight) hours as needed. 03/17/23   Harden Jerona GAILS, MD  oxyCODONE -acetaminophen  (PERCOCET/ROXICET) 5-325 MG tablet Take 1 tablet by mouth every 12 (twelve) hours as needed. 03/19/23   Valdemar Rocky SAUNDERS, NP     Allergies    Ciprofloxacin and Phenergan [promethazine hcl]   Review of Systems   Review of Systems Please see HPI for pertinent positives and negatives  Physical Exam BP 136/79 (BP Location: Right Arm)   Pulse 76   Temp 98.6 F (37 C) (Oral)   Resp 20   SpO2 99%   Physical Exam Vitals and nursing note reviewed.  Constitutional:       Appearance: Normal appearance.  HENT:     Head: Normocephalic and atraumatic.     Nose: Nose normal.     Mouth/Throat:     Mouth: Mucous membranes are moist.  Eyes:     Extraocular Movements: Extraocular movements intact.     Conjunctiva/sclera: Conjunctivae normal.  Cardiovascular:     Rate and Rhythm: Normal rate.  Pulmonary:     Effort: Pulmonary effort is normal.     Breath sounds: Wheezing present.  Abdominal:     General: Abdomen is flat.     Palpations: Abdomen is soft.     Tenderness: There is no abdominal tenderness.  Musculoskeletal:        General: No swelling. Normal range of motion.     Cervical back: Neck supple.  Skin:    General: Skin is warm and dry.  Neurological:     General: No focal deficit present.     Mental Status: Christie Nicholson is alert.  Psychiatric:        Mood and Affect: Mood normal.     ED Results / Procedures / Treatments   EKG None  Procedures Procedures  Medications Ordered in the ED Medications  ipratropium-albuterol  (DUONEB) 0.5-2.5 (3) MG/3ML nebulizer solution 3 mL (3 mLs Nebulization Given 03/10/24 0058)    Initial Impression and Plan  Patient with significant smoking history, recently suspected to have COPD but no formal diagnosis has been ill since influenza diagnosis about 2 weeks ago.  Christie Nicholson is afebrile here, SpO2 borderline low on arrival but 95-96% on RA at rest in the room. Christie Nicholson does have some wheezing, I personally viewed the images from radiology studies and agree with radiologist interpretation: CXR is clear. Will give a neb and reassess for improvement.   ED Course   Clinical Course as of 03/10/24 0148  Wed Mar 10, 2024  0146 Patient's wheezing resolved after neb. Able to ambulate without hypoxia. No indication for admission at this time. Christie Nicholson has one day left of a 5 day steroid burst from last ED visit, will give a longer taper, recommend Christie Nicholson continue to use inhalers at home, tobacco cessation, PCP follow up, RTED for any other  concerns.   [CS]    Clinical Course User Index [CS] Roselyn Carlin NOVAK, MD     MDM Rules/Calculators/A&P Medical Decision Making Problems Addressed: COPD with acute exacerbation [facility]): acute illness or injury  Amount and/or Complexity of Data Reviewed External Data Reviewed: labs, radiology and notes. Radiology: ordered and independent interpretation performed. Decision-making details documented in ED Course.  Risk Prescription drug management. Decision regarding hospitalization.     Final Clinical Impression(s) / ED Diagnoses Final diagnoses:  COPD with acute exacerbation (HCC)    Rx / DC Orders ED Discharge Orders          Ordered    predniSONE  (STERAPRED UNI-PAK 21 TAB) 10 MG (21) TBPK tablet        03/10/24 0147             Roselyn Carlin NOVAK, MD 03/10/24 0148  "

## 2024-03-09 NOTE — ED Triage Notes (Addendum)
 Pt states that she has flu the turned into pneumonia .Pt currently taking antibiotics and steroids. Endorses SOB, fevers, diarrhea, headache. Seen at [facility] multiple times, been taking medications.

## 2024-03-10 MED ORDER — PREDNISONE 10 MG (21) PO TBPK: ORAL_TABLET | ORAL | 0 refills | Status: AC
